# Patient Record
Sex: Male | Born: 1945 | Race: Black or African American | Hispanic: No
Health system: Southern US, Community
[De-identification: ages and names within clinical notes are randomized; demographics above are authoritative.]

## PROBLEM LIST (undated history)

## (undated) DIAGNOSIS — Z992 Dependence on renal dialysis: Secondary | ICD-10-CM

## (undated) DIAGNOSIS — N186 End stage renal disease: Secondary | ICD-10-CM

## (undated) DIAGNOSIS — J9621 Acute and chronic respiratory failure with hypoxia: Secondary | ICD-10-CM

## (undated) DIAGNOSIS — I5022 Chronic systolic (congestive) heart failure: Secondary | ICD-10-CM

## (undated) DIAGNOSIS — G40909 Epilepsy, unspecified, not intractable, without status epilepticus: Secondary | ICD-10-CM

## (undated) DIAGNOSIS — J181 Lobar pneumonia, unspecified organism: Secondary | ICD-10-CM

---

## 2018-05-03 ENCOUNTER — Inpatient Hospital Stay
Admission: AD | Admit: 2018-05-03 | Discharge: 2018-06-21 | Disposition: A | Discharge status: Home Health | Payer: Medicaid Other | Source: Other Acute Inpatient Hospital | Attending: Internal Medicine | Admitting: Internal Medicine

## 2018-05-03 ENCOUNTER — Other Ambulatory Visit (HOSPITAL_COMMUNITY): Payer: Medicaid Other

## 2018-05-03 DIAGNOSIS — G40909 Epilepsy, unspecified, not intractable, without status epilepticus: Secondary | ICD-10-CM

## 2018-05-03 DIAGNOSIS — J181 Lobar pneumonia, unspecified organism: Secondary | ICD-10-CM | POA: Diagnosis present

## 2018-05-03 DIAGNOSIS — Z9911 Dependence on respirator [ventilator] status: Secondary | ICD-10-CM

## 2018-05-03 DIAGNOSIS — R238 Other skin changes: Secondary | ICD-10-CM

## 2018-05-03 DIAGNOSIS — Z9289 Personal history of other medical treatment: Secondary | ICD-10-CM

## 2018-05-03 DIAGNOSIS — R609 Edema, unspecified: Secondary | ICD-10-CM

## 2018-05-03 DIAGNOSIS — J9621 Acute and chronic respiratory failure with hypoxia: Secondary | ICD-10-CM | POA: Diagnosis present

## 2018-05-03 DIAGNOSIS — R509 Fever, unspecified: Secondary | ICD-10-CM

## 2018-05-03 DIAGNOSIS — Z4659 Encounter for fitting and adjustment of other gastrointestinal appliance and device: Secondary | ICD-10-CM

## 2018-05-03 DIAGNOSIS — J9690 Respiratory failure, unspecified, unspecified whether with hypoxia or hypercapnia: Secondary | ICD-10-CM

## 2018-05-03 DIAGNOSIS — Z0189 Encounter for other specified special examinations: Secondary | ICD-10-CM

## 2018-05-03 DIAGNOSIS — Z992 Dependence on renal dialysis: Secondary | ICD-10-CM

## 2018-05-03 DIAGNOSIS — N186 End stage renal disease: Secondary | ICD-10-CM

## 2018-05-03 DIAGNOSIS — I5022 Chronic systolic (congestive) heart failure: Secondary | ICD-10-CM | POA: Diagnosis present

## 2018-05-03 HISTORY — DX: Dependence on renal dialysis: N18.6

## 2018-05-03 HISTORY — DX: Acute and chronic respiratory failure with hypoxia: J96.21

## 2018-05-03 HISTORY — DX: Chronic systolic (congestive) heart failure: I50.22

## 2018-05-03 HISTORY — DX: Lobar pneumonia, unspecified organism: J18.1

## 2018-05-03 HISTORY — DX: Epilepsy, unspecified, not intractable, without status epilepticus: G40.909

## 2018-05-03 HISTORY — DX: Dependence on renal dialysis: Z99.2

## 2018-05-03 LAB — BLOOD GAS, ARTERIAL
Acid-Base Excess: 3.1 mmol/L — ABNORMAL HIGH (ref 0.0–2.0)
Bicarbonate: 27 mmol/L (ref 20.0–28.0)
FIO2: 0.28
LHR: 12 {breaths}/min
O2 Saturation: 96.3 %
PATIENT TEMPERATURE: 98.6
PEEP: 5 cmH2O
PO2 ART: 79.7 mmHg — AB (ref 83.0–108.0)
pCO2 arterial: 40.7 mmHg (ref 32.0–48.0)
pH, Arterial: 7.437 (ref 7.350–7.450)

## 2018-05-03 LAB — CLOSTRIDIUM DIFFICILE BY PCR, REFLEXED: Toxigenic C. Difficile by PCR: POSITIVE — AB

## 2018-05-03 LAB — C DIFFICILE QUICK SCREEN W PCR REFLEX
C DIFFICILE (CDIFF) TOXIN: NEGATIVE
C Diff antigen: POSITIVE — AB

## 2018-05-03 MED ORDER — SODIUM CHLORIDE 0.9 % IV SOLN
150.00 | INTRAVENOUS | Status: DC
Start: ? — End: 2018-05-03

## 2018-05-03 MED ORDER — GENERIC EXTERNAL MEDICATION
5.00 | Status: DC
Start: ? — End: 2018-05-03

## 2018-05-03 MED ORDER — PROMETHAZINE HCL 25 MG/ML IJ SOLN
25.00 | INTRAMUSCULAR | Status: DC
Start: ? — End: 2018-05-03

## 2018-05-03 MED ORDER — DIPHENHYDRAMINE HCL 50 MG/ML IJ SOLN
12.50 | INTRAMUSCULAR | Status: DC
Start: ? — End: 2018-05-03

## 2018-05-03 MED ORDER — GENERIC EXTERNAL MEDICATION
1.00 | Status: DC
Start: ? — End: 2018-05-03

## 2018-05-03 MED ORDER — LIDOCAINE HCL (PF) 1 % IJ SOLN
0.10 | INTRAMUSCULAR | Status: DC
Start: ? — End: 2018-05-03

## 2018-05-03 MED ORDER — EPOETIN ALFA-EPBX 4000 UNIT/ML IJ SOLN
50.00 | INTRAMUSCULAR | Status: DC
Start: 2018-05-04 — End: 2018-05-03

## 2018-05-03 MED ORDER — HYDRALAZINE HCL 50 MG PO TABS
100.00 | ORAL_TABLET | ORAL | Status: DC
Start: 2018-05-03 — End: 2018-05-03

## 2018-05-03 MED ORDER — GENERIC EXTERNAL MEDICATION
1.00 | Status: DC
Start: 2018-05-04 — End: 2018-05-03

## 2018-05-03 MED ORDER — SODIUM CHLORIDE (PF) 0.9 % IJ SOLN
50.00 | INTRAMUSCULAR | Status: DC
Start: ? — End: 2018-05-03

## 2018-05-03 MED ORDER — CHLORHEXIDINE GLUCONATE 0.12 % MT SOLN
15.00 | OROMUCOSAL | Status: DC
Start: 2018-05-03 — End: 2018-05-03

## 2018-05-03 MED ORDER — MANNITOL 25 % IV SOLN
12.50 | INTRAVENOUS | Status: DC
Start: ? — End: 2018-05-03

## 2018-05-03 MED ORDER — ACETAMINOPHEN 160 MG/5ML PO SUSP
650.00 | ORAL | Status: DC
Start: ? — End: 2018-05-03

## 2018-05-03 MED ORDER — ALBUMIN HUMAN 25 % IV SOLN
12.50 | INTRAVENOUS | Status: DC
Start: ? — End: 2018-05-03

## 2018-05-03 MED ORDER — ATENOLOL 50 MG PO TABS
25.00 | ORAL_TABLET | ORAL | Status: DC
Start: 2018-05-03 — End: 2018-05-03

## 2018-05-03 MED ORDER — LORAZEPAM 2 MG/ML IJ SOLN
1.00 | INTRAMUSCULAR | Status: DC
Start: ? — End: 2018-05-03

## 2018-05-03 MED ORDER — HYDRALAZINE HCL 20 MG/ML IJ SOLN
10.00 | INTRAMUSCULAR | Status: DC
Start: ? — End: 2018-05-03

## 2018-05-03 MED ORDER — GENERIC EXTERNAL MEDICATION
15.00 | Status: DC
Start: ? — End: 2018-05-03

## 2018-05-03 MED ORDER — GENERIC EXTERNAL MEDICATION
25.00 | Status: DC
Start: ? — End: 2018-05-03

## 2018-05-03 MED ORDER — FAMOTIDINE 20 MG/2ML IV SOLN
20.00 | INTRAVENOUS | Status: DC
Start: 2018-05-04 — End: 2018-05-03

## 2018-05-03 MED ORDER — AMLODIPINE BESYLATE 10 MG PO TABS
10.00 | ORAL_TABLET | ORAL | Status: DC
Start: 2018-05-03 — End: 2018-05-03

## 2018-05-03 MED ORDER — GENERIC EXTERNAL MEDICATION
750.00 | Status: DC
Start: 2018-05-04 — End: 2018-05-03

## 2018-05-03 MED ORDER — LABETALOL HCL 5 MG/ML IV SOLN
10.00 | INTRAVENOUS | Status: DC
Start: ? — End: 2018-05-03

## 2018-05-03 MED ORDER — HEPARIN SODIUM (PORCINE) 5000 UNIT/ML IJ SOLN
5000.00 | INTRAMUSCULAR | Status: DC
Start: 2018-05-03 — End: 2018-05-03

## 2018-05-03 MED ORDER — NICARDIPINE HCL IN NACL 40-0.83 MG/200ML-% IV SOLN
1.00 | INTRAVENOUS | Status: DC
Start: ? — End: 2018-05-03

## 2018-05-03 MED ORDER — SODIUM CHLORIDE 3 % IN NEBU
4.00 | INHALATION_SOLUTION | RESPIRATORY_TRACT | Status: DC
Start: ? — End: 2018-05-03

## 2018-05-03 MED ORDER — LEVETIRACETAM IN NACL 500 MG/100ML IV SOLN
500.00 | INTRAVENOUS | Status: DC
Start: 2018-05-04 — End: 2018-05-03

## 2018-05-03 MED ORDER — GUAIFENESIN 100 MG/5ML PO LIQD
200.00 | ORAL | Status: DC
Start: ? — End: 2018-05-03

## 2018-05-03 MED ORDER — ONDANSETRON HCL 4 MG/2ML IJ SOLN
4.00 | INTRAMUSCULAR | Status: DC
Start: ? — End: 2018-05-03

## 2018-05-04 ENCOUNTER — Other Ambulatory Visit (HOSPITAL_COMMUNITY): Payer: Medicaid Other

## 2018-05-04 DIAGNOSIS — N186 End stage renal disease: Secondary | ICD-10-CM | POA: Diagnosis not present

## 2018-05-04 DIAGNOSIS — J181 Lobar pneumonia, unspecified organism: Secondary | ICD-10-CM

## 2018-05-04 DIAGNOSIS — J9621 Acute and chronic respiratory failure with hypoxia: Secondary | ICD-10-CM

## 2018-05-04 DIAGNOSIS — I5022 Chronic systolic (congestive) heart failure: Secondary | ICD-10-CM | POA: Diagnosis not present

## 2018-05-04 DIAGNOSIS — Z992 Dependence on renal dialysis: Secondary | ICD-10-CM

## 2018-05-04 DIAGNOSIS — G40909 Epilepsy, unspecified, not intractable, without status epilepticus: Secondary | ICD-10-CM

## 2018-05-04 LAB — CBC WITH DIFFERENTIAL/PLATELET
ABS IMMATURE GRANULOCYTES: 0.11 10*3/uL — AB (ref 0.00–0.07)
Basophils Absolute: 0 10*3/uL (ref 0.0–0.1)
Basophils Relative: 0 %
Eosinophils Absolute: 0.6 10*3/uL — ABNORMAL HIGH (ref 0.0–0.5)
Eosinophils Relative: 5 %
HCT: 25.8 % — ABNORMAL LOW (ref 39.0–52.0)
Hemoglobin: 8.1 g/dL — ABNORMAL LOW (ref 13.0–17.0)
Immature Granulocytes: 1 %
Lymphocytes Relative: 3 %
Lymphs Abs: 0.4 10*3/uL — ABNORMAL LOW (ref 0.7–4.0)
MCH: 33.1 pg (ref 26.0–34.0)
MCHC: 31.4 g/dL (ref 30.0–36.0)
MCV: 105.3 fL — AB (ref 80.0–100.0)
Monocytes Absolute: 1 10*3/uL (ref 0.1–1.0)
Monocytes Relative: 8 %
Neutro Abs: 10.5 10*3/uL — ABNORMAL HIGH (ref 1.7–7.7)
Neutrophils Relative %: 83 %
PLATELETS: 347 10*3/uL (ref 150–400)
RBC: 2.45 MIL/uL — ABNORMAL LOW (ref 4.22–5.81)
RDW: 17.3 % — ABNORMAL HIGH (ref 11.5–15.5)
WBC: 12.5 10*3/uL — ABNORMAL HIGH (ref 4.0–10.5)
nRBC: 0 % (ref 0.0–0.2)

## 2018-05-04 LAB — COMPREHENSIVE METABOLIC PANEL
ALT: 30 U/L (ref 0–44)
AST: 28 U/L (ref 15–41)
Albumin: 2.3 g/dL — ABNORMAL LOW (ref 3.5–5.0)
Alkaline Phosphatase: 89 U/L (ref 38–126)
Anion gap: 16 — ABNORMAL HIGH (ref 5–15)
BUN: 66 mg/dL — ABNORMAL HIGH (ref 8–23)
CO2: 25 mmol/L (ref 22–32)
Calcium: 9.3 mg/dL (ref 8.9–10.3)
Chloride: 95 mmol/L — ABNORMAL LOW (ref 98–111)
Creatinine, Ser: 7.2 mg/dL — ABNORMAL HIGH (ref 0.61–1.24)
GFR calc Af Amer: 8 mL/min — ABNORMAL LOW (ref >60)
GFR, EST NON AFRICAN AMERICAN: 7 mL/min — AB (ref >60)
Glucose, Bld: 114 mg/dL — ABNORMAL HIGH (ref 70–99)
Potassium: 3.4 mmol/L — ABNORMAL LOW (ref 3.5–5.1)
Sodium: 136 mmol/L (ref 135–145)
Total Bilirubin: 0.6 mg/dL (ref 0.3–1.2)
Total Protein: 7.5 g/dL (ref 6.5–8.1)

## 2018-05-04 LAB — EXPECTORATED SPUTUM ASSESSMENT W GRAM STAIN, RFLX TO RESP C

## 2018-05-04 LAB — HEMOGLOBIN A1C
Hgb A1c MFr Bld: 4.7 % — ABNORMAL LOW (ref 4.8–5.6)
Mean Plasma Glucose: 88.19 mg/dL

## 2018-05-04 LAB — EXPECTORATED SPUTUM ASSESSMENT W REFEX TO RESP CULTURE

## 2018-05-04 LAB — MAGNESIUM: MAGNESIUM: 2.5 mg/dL — AB (ref 1.7–2.4)

## 2018-05-04 LAB — PROTIME-INR
INR: 1 (ref 0.8–1.2)
PROTHROMBIN TIME: 12.7 s (ref 11.4–15.2)

## 2018-05-04 LAB — TSH: TSH: 4.028 u[IU]/mL (ref 0.350–4.500)

## 2018-05-04 LAB — PHOSPHORUS: Phosphorus: 4.5 mg/dL (ref 2.5–4.6)

## 2018-05-04 NOTE — Consult Note (Signed)
Pulmonary [place]  Date of Service: 05/04/2018  PULMONARY CRITICAL CARE CONSULT   Jared Hancock  CBU:[id number]  DOB: 12-19-1945   DOA: 05/03/2018  Referring Physician: Merton Border, MD  HPI: Jared Hancock is a 73 y.o. male seen for follow up of Acute on Chronic Respiratory Failure.  Patient has multiple medical problems including COPD end-stage renal disease hypertension cirrhosis hepatitis C chronic systolic heart failure depression presented to the hospital with an acute onset of seizure.  Patient also had a seizure in the emergency department patient was given Versed and Ativan along with Keppra.  He was admitted to the neuro ICU intubated but was unresponsive because of agonal respirations.  Apparently patient developed hypotension and had a cardiac arrest.  Patient received CPR with return of circulation.  As far as renal failure is concerned the patient was seen by nephrology for dialysis.  An echocardiogram was done noted to have an ejection fraction of 55 to 60%.  His remainder of the admission he had a continuous EEG done and his meds were titrated accordingly.  CT scan was done and it was unremarkable.  MRI was not done.  Subsequently patient was transferred to our facility for further management and weaning.  Review of Systems:  ROS performed and is unremarkable other than noted above.  Past Medical History:  Diagnosis Date  ?. Gout  ?Marland Kitchen Hypertension  ?Marland Kitchen Renal disorder  ?. Seizure (*) 04/20/2018   Past Surgical History:  Procedure Laterality Date  ?Marland Kitchen Bowel resection  ?Marland Kitchen Thrombectomy Left 01/27/2018  Thrombectomy w/PTA Left Arm AVF/Dr.Workman   Allergies  Allergen Reactions  ?Marland Kitchen Ampicillin Anaphylaxis  ?Marland Kitchen Shellfish Allergy Anaphylaxis  ?Marland Kitchen Tape [Contact Dermatitis] Rash  Paper tape is ok  ?Marland Kitchen Tetracyclines & Related Swelling   Social history: Patient not able to volunteer Based on the chart patient has a history  of smoking no alcohol or drug abuse  Family history: Positive for hypertension alcohol abuse cancer liver disease  Medications: Reviewed on Rounds  Physical Exam:  Vitals: Temperature 97.2 pulse 66 respiratory rate 19 blood pressure 168/90 saturations 100%  Ventilator Settings patient is pressure assist control FiO2 is 28% tidal volume is 500 PEEP 5  . General: Comfortable at this time . Eyes: Grossly normal lids, irises & conjunctiva . ENT: grossly tongue is normal . Neck: no obvious mass . Cardiovascular: S1-S2 normal no gallop or rub . Respiratory: Scattered distant rhonchi are noted . Abdomen: Soft and nontender at this time . Skin: no rash seen on limited exam . Musculoskeletal: not rigid . Psychiatric:unable to assess . Neurologic: no seizure no involuntary movements         Labs on Admission:  Basic Metabolic Panel: Recent Labs  Lab 05/04/18 0651  NA 136  K 3.4*  CL 95*  CO2 25  GLUCOSE 114*  BUN 66*  CREATININE 7.20*  CALCIUM 9.3  MG 2.5*  PHOS 4.5    Recent Labs  Lab 05/03/18 1740  PHART 7.437  PCO2ART 40.7  PO2ART 79.7*  HCO3 27.0  O2SAT 96.3    Liver Function Tests: Recent Labs  Lab 05/04/18 0651  AST 28  ALT 30  ALKPHOS 89  BILITOT 0.6  PROT 7.5  ALBUMIN 2.3*   No results for input(s): LIPASE, AMYLASE in the last 168 hours. No results for input(s): AMMONIA in the last 168 hours.  CBC: Recent Labs  Lab 05/04/18 0651  WBC 12.5*  NEUTROABS  10.5*  HGB 8.1*  HCT 25.8*  MCV 105.3*  PLT 347    Cardiac Enzymes: No results for input(s): CKTOTAL, CKMB, CKMBINDEX, TROPONINI in the last 168 hours.  BNP (last 3 results) No results for input(s): BNP in the last 8760 hours.  ProBNP (last 3 results) No results for input(s): PROBNP in the last 8760 hours.   Radiological Exams on Admission: Dg Chest Port 1 View  Result Date: 05/03/2018 CLINICAL DATA:  Endotracheal tube placement. EXAM: PORTABLE CHEST 1 VIEW COMPARISON:  None.  FINDINGS: Endotracheal tube tip projects 7 cm above the carina. LEFT internal jugular central venous catheter distal tip projects in proximal superior vena cava. Nasogastric tube past GE junction, distal tip out of field of view. Tunneled dialysis catheter via RIGHT internal jugular venous approach distal tip projects in mid superior vena cava. LEFT axillary vascular stent. Mild cardiomegaly. Mildly calcified aortic arch. Bronchitic changes without pleural effusion or focal consolidation. No pneumothorax. Soft tissue planes included osseous structures are non suspicious. IMPRESSION: 1. Endotracheal tube tip projects 7 cm above the carina. LEFT internal jugular central venous catheter distal tip projects in proximal superior vena cava. Nasogastric tube past GE junction. 2. Mild cardiomegaly. Bronchitic changes without focal consolidation. Electronically Signed   By: Elon Alas M.D.   On: 05/03/2018 19:46   Dg Abd Portable 1v  Result Date: 05/03/2018 CLINICAL DATA:  OG tube EXAM: PORTABLE ABDOMEN - 1 VIEW COMPARISON:  None. FINDINGS: Esophageal tube tip projects over the distal stomach. Probable vascular calcifications within the upper quadrants. Gas pattern is nonobstructed. Metallic opacities within the pelvis. IMPRESSION: 1. Esophageal tube tip overlies the distal stomach. 2. Nonobstructed gas pattern Electronically Signed   By: Donavan Foil M.D.   On: 05/03/2018 19:46    Assessment/Plan Active Problems:   Acute on chronic respiratory failure with hypoxia (HCC)   Seizure disorder (HCC)   End stage renal disease on dialysis ([place])   Lobar pneumonia (HCC)   Chronic systolic heart failure ([place])   1. Acute on chronic respiratory failure with hypoxia at this time patient is on the ventilator and full support.  We are going to try to begin the weaning process.  Patient has not tolerated the RSB I is at the other facility.  We will reassess 2. Seizure disorder currently patient does not have any  active seizures we will continue with supportive care. 3. End-stage renal disease on hemodialysis nephrology will follow and manage the dialysis 4. Lobar pneumonia patient has been treated with antibiotics we will continue with supportive care. 5. Chronic systolic heart failure last ejection fraction was adequate continue with supportive care and monitor.  I have personally seen and evaluated the patient, evaluated laboratory and imaging results, formulated the assessment and plan and placed orders. The Patient requires high complexity decision making for assessment and support.  Case was discussed on Rounds with the Respiratory Therapy Staff Time Spent 29minutes  Allyne Gee, MD [facility] Pulmonary Critical Care Medicine Sleep Medicine

## 2018-05-05 ENCOUNTER — Encounter: Payer: Self-pay | Admitting: Internal Medicine

## 2018-05-05 DIAGNOSIS — J181 Lobar pneumonia, unspecified organism: Secondary | ICD-10-CM | POA: Diagnosis present

## 2018-05-05 DIAGNOSIS — I5022 Chronic systolic (congestive) heart failure: Secondary | ICD-10-CM | POA: Diagnosis not present

## 2018-05-05 DIAGNOSIS — J9621 Acute and chronic respiratory failure with hypoxia: Secondary | ICD-10-CM | POA: Diagnosis not present

## 2018-05-05 DIAGNOSIS — N186 End stage renal disease: Secondary | ICD-10-CM | POA: Diagnosis not present

## 2018-05-05 DIAGNOSIS — G40909 Epilepsy, unspecified, not intractable, without status epilepticus: Secondary | ICD-10-CM

## 2018-05-05 DIAGNOSIS — Z992 Dependence on renal dialysis: Secondary | ICD-10-CM

## 2018-05-05 LAB — RENAL FUNCTION PANEL
Albumin: 2.3 g/dL — ABNORMAL LOW (ref 3.5–5.0)
Anion gap: 16 — ABNORMAL HIGH (ref 5–15)
BUN: 81 mg/dL — ABNORMAL HIGH (ref 8–23)
CO2: 21 mmol/L — ABNORMAL LOW (ref 22–32)
Calcium: 9.2 mg/dL (ref 8.9–10.3)
Chloride: 97 mmol/L — ABNORMAL LOW (ref 98–111)
Creatinine, Ser: 8.73 mg/dL — ABNORMAL HIGH (ref 0.61–1.24)
GFR calc Af Amer: 6 mL/min — ABNORMAL LOW (ref >60)
GFR calc non Af Amer: 6 mL/min — ABNORMAL LOW (ref >60)
Glucose, Bld: 106 mg/dL — ABNORMAL HIGH (ref 70–99)
Phosphorus: 4.6 mg/dL (ref 2.5–4.6)
Potassium: 3.6 mmol/L (ref 3.5–5.1)
Sodium: 134 mmol/L — ABNORMAL LOW (ref 135–145)

## 2018-05-05 LAB — CBC
HCT: 26.3 % — ABNORMAL LOW (ref 39.0–52.0)
Hemoglobin: 8.5 g/dL — ABNORMAL LOW (ref 13.0–17.0)
MCH: 33.9 pg (ref 26.0–34.0)
MCHC: 32.3 g/dL (ref 30.0–36.0)
MCV: 104.8 fL — ABNORMAL HIGH (ref 80.0–100.0)
NRBC: 0 % (ref 0.0–0.2)
Platelets: 390 10*3/uL (ref 150–400)
RBC: 2.51 MIL/uL — ABNORMAL LOW (ref 4.22–5.81)
RDW: 17.2 % — ABNORMAL HIGH (ref 11.5–15.5)
WBC: 10.7 10*3/uL — AB (ref 4.0–10.5)

## 2018-05-05 LAB — MAGNESIUM: Magnesium: 2.7 mg/dL — ABNORMAL HIGH (ref 1.7–2.4)

## 2018-05-05 NOTE — Consult Note (Signed)
[facility] CONSULT NOTE    Date: 05/05/2018                  Patient Name:  Jared Hancock  MRN: [id number]  DOB: [birth year]  Age / Sex: 73 y.o., male         PCP: Rosaria Ferries, MD                 Service Requesting Consult: Hospitalist                 Reason for Consult: Evaluation and management of ESRD            History of Present Illness: Patient is a 73 y.o. male with a PMHx of COPD, end-stage renal disease, hypertension, hepatitis C, cirrhosis, chronic systolic heart failure, depression, anemia of chronic kidney disease, secondary hyperparathyroidism, seizure disorder, who was admitted to [facility] on 05/03/2018 for ongoing treatment of respiratory failure and end-stage renal disease.  He presented to outside hospital with new onset weakness and seizures at home.  He was admitted to the neuro ICU and emergently intubated.  He had a brief cardiac arrest with PEA with return to spontaneous circulation achieved after 1 round of CPR.  Patient remains intubated at this time.  We are asked to see him for evaluation management of end-stage renal disease.  He was successfully dialyzed during his hospitalization previously.  He received Epogen for his anemia of chronic kidney disease.   He is not on any binder therapy at the moment.   Medications:  Current medications: Cefepime 1 g daily, fluconazole 200 mg daily, Keppra 750 mg daily, Humalog sliding scale, amlodipine 10 mg daily, atenolol 25 mg twice daily, Pepcid 20 mg twice daily, fluconazole 200 mg daily, heparin 5000 units every 8 hours, hydralazine 1 mg 3 times daily, scopolamine patch every 72 hours   Allergies: Ampicillin, tetracycline, shellfish derived products   Past Medical History: Past Medical History:  Diagnosis Date  . Acute on chronic respiratory failure with hypoxia ([place])   . Chronic systolic heart failure ([place])   . End stage renal disease on dialysis ([place])   . Lobar pneumonia ([place])    . Seizure disorder [facility])      Past Surgical History: Dialysis catheter placement. Bowel resection Thrombectomy of left arm AV fistula  Family History: Significant for hypertension and liver disease  Social History: Unable to obtain directly from the patient however per prior records it appears that he does have history of tobacco use.  Review of Systems: Unable to obtain as he is maintained on the ventilator.  Vital Signs: Temperature 97.8 pulse 57 respirations 20 blood pressure 136/64 Weight trends: There were no vitals filed for this visit.  Physical Exam: General: Critically ill appearing  Head: Normocephalic, atraumatic.  Eyes: Anicteric  Nose: Mucous membranes moist, not inflammed, nonerythematous.  Throat: ETT in place  Neck: Supple, trachea midline.  Lungs:  Scattered rhonchi, vent assisted  Heart: S1S2 no rubs  Abdomen:  BS normoactive. Soft, Nondistended, non-tender.  No masses or organomegaly.  Extremities: Trace LE edema  Neurologic: Intubated, not following commands  Skin: No visible rashes, scars.    Lab results: Basic Metabolic Panel: Recent Labs  Lab 05/04/18 0651 05/05/18 0549  NA 136 134*  K 3.4* 3.6  CL 95* 97*  CO2 25 21*  GLUCOSE 114* 106*  BUN 66* 81*  CREATININE 7.20* 8.73*  CALCIUM 9.3 9.2  MG 2.5* 2.7*  PHOS 4.5 4.6  Liver Function Tests: Recent Labs  Lab 05/04/18 0651 05/05/18 0549  AST 28  --   ALT 30  --   ALKPHOS 89  --   BILITOT 0.6  --   PROT 7.5  --   ALBUMIN 2.3* 2.3*   No results for input(s): LIPASE, AMYLASE in the last 168 hours. No results for input(s): AMMONIA in the last 168 hours.  CBC: Recent Labs  Lab 05/04/18 0651 05/05/18 0549  WBC 12.5* 10.7*  NEUTROABS 10.5*  --   HGB 8.1* 8.5*  HCT 25.8* 26.3*  MCV 105.3* 104.8*  PLT 347 390    Cardiac Enzymes: No results for input(s): CKTOTAL, CKMB, CKMBINDEX, TROPONINI in the last 168 hours.  BNP: Invalid input(s): POCBNP  CBG: No results for  input(s): GLUCAP in the last 168 hours.  Microbiology: Results for orders placed or performed during the hospital encounter of 05/03/18  C difficile quick scan w PCR reflex     Status: Abnormal   Collection Time: 05/03/18  7:40 PM  Result Value Ref Range Status   C Diff antigen POSITIVE (A) NEGATIVE Final   C Diff toxin NEGATIVE NEGATIVE Final   C Diff interpretation Results are indeterminate. See PCR results.  Final    Comment: Performed at [facility], [place] [address], [place], [place] [address]  C. Diff by PCR, Reflexed     Status: Abnormal   Collection Time: 05/03/18  7:40 PM  Result Value Ref Range Status   Toxigenic C. Difficile by PCR POSITIVE (A) NEGATIVE Final    Comment: Positive for toxigenic C. difficile with little to no toxin production. Only treat if clinical presentation suggests symptomatic illness. Performed at [facility], [place] [address], [place], [place] [address]   Expectorated sputum assessment w rflx to resp cult     Status: None   Collection Time: 05/04/18  9:21 AM  Result Value Ref Range Status   Specimen Description EXPECTORATED SPUTUM  Final   Special Requests NONE  Final   Sputum evaluation   Final    THIS SPECIMEN IS ACCEPTABLE FOR SPUTUM CULTURE Performed at [facility], [place] [address] [place], [place] [address]    Report Status 05/04/2018 FINAL  Final  Culture, respiratory     Status: None (Preliminary result)   Collection Time: 05/04/18  9:21 AM  Result Value Ref Range Status   Specimen Description EXPECTORATED SPUTUM  Final   Special Requests NONE Reflexed from [id number]  Final   Gram Stain   Final    ABUNDANT WBC PRESENT, PREDOMINANTLY PMN RARE SQUAMOUS EPITHELIAL CELLS PRESENT NO ORGANISMS SEEN    Culture   Final    CULTURE REINCUBATED FOR BETTER GROWTH Performed at [facility], [place] [address], [place], [place] [address]    Report Status PENDING  Incomplete    Coagulation Studies: Recent Labs    05/04/18 0651   LABPROT 12.7  INR 1.0    Urinalysis: No results for input(s): COLORURINE, LABSPEC, PHURINE, GLUCOSEU, HGBUR, BILIRUBINUR, KETONESUR, PROTEINUR, UROBILINOGEN, NITRITE, LEUKOCYTESUR in the last 72 hours.  Invalid input(s): APPERANCEUR    Imaging: Dg Chest Port 1 View  Result Date: 05/04/2018 CLINICAL DATA:  New ETT cuff, and new NG tube EXAM: PORTABLE CHEST 1 VIEW COMPARISON:  05/03/2018 FINDINGS: Endotracheal tube tip projects 2.4 cm above the carina. Nasal/orogastric tube passes below the diaphragm into the mid stomach. Tunneled dual lumen central venous right internal jugular catheter and left central venous catheter  are stable. Cardiac silhouette is normal in size. No mediastinal or hilar masses. Mild atelectasis at the left lung base.  Lungs otherwise clear. No pneumothorax. IMPRESSION: 1. New endotracheal tube tip projects 2.4 cm above the carina. 2. Nasal/orogastric tube passes below the diaphragm into the mid stomach. 3. Mild left lung base atelectasis. Lungs otherwise clear. No other change from the prior exam. Electronically Signed   By: Lajean Manes M.D.   On: 05/04/2018 18:01   Dg Chest Port 1 View  Result Date: 05/03/2018 CLINICAL DATA:  Endotracheal tube placement. EXAM: PORTABLE CHEST 1 VIEW COMPARISON:  None. FINDINGS: Endotracheal tube tip projects 7 cm above the carina. LEFT internal jugular central venous catheter distal tip projects in proximal superior vena cava. Nasogastric tube past GE junction, distal tip out of field of view. Tunneled dialysis catheter via RIGHT internal jugular venous approach distal tip projects in mid superior vena cava. LEFT axillary vascular stent. Mild cardiomegaly. Mildly calcified aortic arch. Bronchitic changes without pleural effusion or focal consolidation. No pneumothorax. Soft tissue planes included osseous structures are non suspicious. IMPRESSION: 1. Endotracheal tube tip projects 7 cm above the carina. LEFT internal jugular central venous  catheter distal tip projects in proximal superior vena cava. Nasogastric tube past GE junction. 2. Mild cardiomegaly. Bronchitic changes without focal consolidation. Electronically Signed   By: Elon Alas M.D.   On: 05/03/2018 19:46   Dg Abd Portable 1v  Result Date: 05/03/2018 CLINICAL DATA:  OG tube EXAM: PORTABLE ABDOMEN - 1 VIEW COMPARISON:  None. FINDINGS: Esophageal tube tip projects over the distal stomach. Probable vascular calcifications within the upper quadrants. Gas pattern is nonobstructed. Metallic opacities within the pelvis. IMPRESSION: 1. Esophageal tube tip overlies the distal stomach. 2. Nonobstructed gas pattern Electronically Signed   By: Donavan Foil M.D.   On: 05/03/2018 19:46      Assessment & Plan: Pt is a 73 y.o. male  with a PMHx of COPD, end-stage renal disease, hypertension, hepatitis C, cirrhosis, chronic systolic heart failure, depression, anemia of chronic kidney disease, secondary hyperparathyroidism, seizure disorder, who was admitted to [facility] on 05/03/2018 for ongoing treatment of respiratory failure and end-stage renal disease.   1.  ESRD on HD.  We have seen and evaluated the patient on hemodialysis today.  Tolerating well.  Ultrafiltration target 1.5 kg.  We plan to complete dialysis treatment today and next dialysis treatment will be on Monday.  2.  Acute respiratory failure.  Continue current ventilatory support.  Weaning as per pulmonary/critical care.  3.  Anemia of chronic kidney disease.  Hemoglobin currently 8.5.  Start the patient on Retacrit 10,000 units IV with dialysis.  4.  Secondary hyperparathyroidism.  Check intact PTH and phosphorus during this admission.  Patient off of Renvela at the moment.  5.  Thanks for consultation.

## 2018-05-05 NOTE — Progress Notes (Signed)
Pulmonary Critical Care Medicine [place]   PULMONARY CRITICAL CARE SERVICE  PROGRESS NOTE  Date of Service: 05/05/2018  TRAETON BORDAS  IOE:[id number]  DOB: [birth year]   DOA: 05/03/2018  Referring Physician: Merton Border, MD  HPI: Jared Hancock is a 73 y.o. male seen for follow up of Acute on Chronic Respiratory Failure.  This morning patient remains on the ventilator.  Has not yet been attempted on the wean.  Right now is on full support and pressure control mode  Medications: Reviewed on Rounds  Physical Exam:  Vitals: Temperature 97.8 pulse 57 respiratory rate 20 blood pressure 130/64 saturations 98%  Ventilator Settings mode ventilation pressure assist control FiO2 28% tidal volume 521 PEEP 5  . General: Comfortable at this time . Eyes: Grossly normal lids, irises & conjunctiva . ENT: grossly tongue is normal . Neck: no obvious mass . Cardiovascular: S1 S2 normal no gallop . Respiratory: No rhonchi or rales are noted at this time . Abdomen: soft . Skin: no rash seen on limited exam . Musculoskeletal: not rigid . Psychiatric:unable to assess . Neurologic: no seizure no involuntary movements         Lab Data:   Basic Metabolic Panel: Recent Labs  Lab 05/04/18 0651 05/05/18 0549  NA 136 134*  K 3.4* 3.6  CL 95* 97*  CO2 25 21*  GLUCOSE 114* 106*  BUN 66* 81*  CREATININE 7.20* 8.73*  CALCIUM 9.3 9.2  MG 2.5* 2.7*  PHOS 4.5 4.6    ABG: Recent Labs  Lab 05/03/18 1740  PHART 7.437  PCO2ART 40.7  PO2ART 79.7*  HCO3 27.0  O2SAT 96.3    Liver Function Tests: Recent Labs  Lab 05/04/18 0651 05/05/18 0549  AST 28  --   ALT 30  --   ALKPHOS 89  --   BILITOT 0.6  --   PROT 7.5  --   ALBUMIN 2.3* 2.3*   No results for input(s): LIPASE, AMYLASE in the last 168 hours. No results for input(s): AMMONIA in the last 168 hours.  CBC: Recent Labs  Lab 05/04/18 0651 05/05/18 0549  WBC 12.5* 10.7*  NEUTROABS 10.5*  --   HGB 8.1* 8.5*   HCT 25.8* 26.3*  MCV 105.3* 104.8*  PLT 347 390    Cardiac Enzymes: No results for input(s): CKTOTAL, CKMB, CKMBINDEX, TROPONINI in the last 168 hours.  BNP (last 3 results) No results for input(s): BNP in the last 8760 hours.  ProBNP (last 3 results) No results for input(s): PROBNP in the last 8760 hours.  Radiological Exams: Dg Chest Port 1 View  Result Date: 05/04/2018 CLINICAL DATA:  New ETT cuff, and new NG tube EXAM: PORTABLE CHEST 1 VIEW COMPARISON:  05/03/2018 FINDINGS: Endotracheal tube tip projects 2.4 cm above the carina. Nasal/orogastric tube passes below the diaphragm into the mid stomach. Tunneled dual lumen central venous right internal jugular catheter and left central venous catheter are stable. Cardiac silhouette is normal in size. No mediastinal or hilar masses. Mild atelectasis at the left lung base.  Lungs otherwise clear. No pneumothorax. IMPRESSION: 1. New endotracheal tube tip projects 2.4 cm above the carina. 2. Nasal/orogastric tube passes below the diaphragm into the mid stomach. 3. Mild left lung base atelectasis. Lungs otherwise clear. No other change from the prior exam. Electronically Signed   By: Lajean Manes M.D.   On: 05/04/2018 18:01   Dg Chest Port 1 View  Result Date: 05/03/2018 CLINICAL DATA:  Endotracheal tube placement.  EXAM: PORTABLE CHEST 1 VIEW COMPARISON:  None. FINDINGS: Endotracheal tube tip projects 7 cm above the carina. LEFT internal jugular central venous catheter distal tip projects in proximal superior vena cava. Nasogastric tube past GE junction, distal tip out of field of view. Tunneled dialysis catheter via RIGHT internal jugular venous approach distal tip projects in mid superior vena cava. LEFT axillary vascular stent. Mild cardiomegaly. Mildly calcified aortic arch. Bronchitic changes without pleural effusion or focal consolidation. No pneumothorax. Soft tissue planes included osseous structures are non suspicious. IMPRESSION: 1.  Endotracheal tube tip projects 7 cm above the carina. LEFT internal jugular central venous catheter distal tip projects in proximal superior vena cava. Nasogastric tube past GE junction. 2. Mild cardiomegaly. Bronchitic changes without focal consolidation. Electronically Signed   By: Elon Alas M.D.   On: 05/03/2018 19:46   Dg Abd Portable 1v  Result Date: 05/03/2018 CLINICAL DATA:  OG tube EXAM: PORTABLE ABDOMEN - 1 VIEW COMPARISON:  None. FINDINGS: Esophageal tube tip projects over the distal stomach. Probable vascular calcifications within the upper quadrants. Gas pattern is nonobstructed. Metallic opacities within the pelvis. IMPRESSION: 1. Esophageal tube tip overlies the distal stomach. 2. Nonobstructed gas pattern Electronically Signed   By: Donavan Foil M.D.   On: 05/03/2018 19:46    Assessment/Plan Active Problems:   Acute on chronic respiratory failure with hypoxia (HCC)   Seizure disorder (HCC)   End stage renal disease on dialysis ([place])   Lobar pneumonia (HCC)   Chronic systolic heart failure ([place])   1. Acute on chronic respiratory failure with hypoxia we will continue with full support on pressure control titrate oxygen continue pulmonary toilet. 2. Seizure disorder no active seizures are noted. 3. End-stage renal disease on dialysis followed by nephrology 4. Lobar pneumonia treated clinically improving 5. Chronic systolic heart failure compensated at baseline continue with dialysis for fluid management   I have personally seen and evaluated the patient, evaluated laboratory and imaging results, formulated the assessment and plan and placed orders. The Patient requires high complexity decision making for assessment and support.  Case was discussed on Rounds with the Respiratory Therapy Staff  Allyne Gee, MD [facility] Pulmonary Critical Care Medicine Sleep Medicine

## 2018-05-06 DIAGNOSIS — N186 End stage renal disease: Secondary | ICD-10-CM | POA: Diagnosis not present

## 2018-05-06 DIAGNOSIS — I5022 Chronic systolic (congestive) heart failure: Secondary | ICD-10-CM | POA: Diagnosis not present

## 2018-05-06 DIAGNOSIS — J9621 Acute and chronic respiratory failure with hypoxia: Secondary | ICD-10-CM | POA: Diagnosis not present

## 2018-05-06 DIAGNOSIS — J181 Lobar pneumonia, unspecified organism: Secondary | ICD-10-CM | POA: Diagnosis not present

## 2018-05-06 LAB — CULTURE, RESPIRATORY W GRAM STAIN: Culture: NORMAL

## 2018-05-06 LAB — PTH, INTACT AND CALCIUM
Calcium, Total (PTH): 8.9 mg/dL (ref 8.6–10.2)
PTH: 52 pg/mL (ref 15–65)

## 2018-05-06 LAB — HEPATITIS B SURFACE ANTIGEN: Hepatitis B Surface Ag: NEGATIVE

## 2018-05-06 LAB — HEPATITIS B CORE ANTIBODY, IGM: Hep B C IgM: NEGATIVE

## 2018-05-06 LAB — HEPATITIS B SURFACE ANTIBODY, QUANTITATIVE: Hep B S AB Quant (Post): 3.5 m[IU]/mL — ABNORMAL LOW (ref >9.9)

## 2018-05-06 NOTE — Progress Notes (Addendum)
Pulmonary Critical Care Medicine [place]   PULMONARY CRITICAL CARE SERVICE  PROGRESS NOTE  Date of Service: 05/06/2018  Jared Hancock  TIR:[id number]  DOB: 1945-07-23   DOA: 05/03/2018  Referring Physician: Merton Border, MD  HPI: Jared Hancock is a 73 y.o. male seen for follow up of Acute on Chronic Respiratory Failure.  Patient has a goal of 8 hours on pressure support today 12/5 with an FiO2 28%.  He did well with this and was placed back on the ventilator to rest.  Medications: Reviewed on Rounds  Physical Exam:  Vitals: Pulse 70 respirations 29 BP 150/76 O2 sat 97% temp 99.1  Ventilator Settings ventilator mode AC PC rate of 12 IP 14 PEEP of 5 FiO2 20%  . General: Comfortable at this time . Eyes: Grossly normal lids, irises & conjunctiva . ENT: grossly tongue is normal . Neck: no obvious mass . Cardiovascular: S1 S2 normal no gallop . Respiratory: No rales or rhonchi noted . Abdomen: soft . Skin: no rash seen on limited exam . Musculoskeletal: not rigid . Psychiatric:unable to assess . Neurologic: no seizure no involuntary movements         Lab Data:   Basic Metabolic Panel: Recent Labs  Lab 05/04/18 0651 05/05/18 0549 05/05/18 0738  NA 136 134*  --   K 3.4* 3.6  --   CL 95* 97*  --   CO2 25 21*  --   GLUCOSE 114* 106*  --   BUN 66* 81*  --   CREATININE 7.20* 8.73*  --   CALCIUM 9.3 9.2 8.9  MG 2.5* 2.7*  --   PHOS 4.5 4.6  --     ABG: Recent Labs  Lab 05/03/18 1740  PHART 7.437  PCO2ART 40.7  PO2ART 79.7*  HCO3 27.0  O2SAT 96.3    Liver Function Tests: Recent Labs  Lab 05/04/18 0651 05/05/18 0549  AST 28  --   ALT 30  --   ALKPHOS 89  --   BILITOT 0.6  --   PROT 7.5  --   ALBUMIN 2.3* 2.3*   No results for input(s): LIPASE, AMYLASE in the last 168 hours. No results for input(s): AMMONIA in the last 168 hours.  CBC: Recent Labs  Lab 05/04/18 0651 05/05/18 0549  WBC 12.5* 10.7*  NEUTROABS 10.5*  --   HGB 8.1*  8.5*  HCT 25.8* 26.3*  MCV 105.3* 104.8*  PLT 347 390    Cardiac Enzymes: No results for input(s): CKTOTAL, CKMB, CKMBINDEX, TROPONINI in the last 168 hours.  BNP (last 3 results) No results for input(s): BNP in the last 8760 hours.  ProBNP (last 3 results) No results for input(s): PROBNP in the last 8760 hours.  Radiological Exams: No results found.  Assessment/Plan Active Problems:   Acute on chronic respiratory failure with hypoxia (HCC)   Seizure disorder (HCC)   End stage renal disease on dialysis ([place])   Lobar pneumonia (HCC)   Chronic systolic heart failure ([place])   1. Acute on chronic respiratory failure with hypoxia continue with full support on pressure control and titrate oxygen as tolerated.  Continue aggressive pulmonary toilet and supportive measures 2. Seizure disorder no active seizures noted 3. NSTEMI disease on dialysis followed by nephrology 4. Lobar ammonia treated clinically improving 5. Chronic systolic failure compensated at baseline continue dialysis for fluid management   I have personally seen and evaluated the patient, evaluated laboratory and imaging results, formulated the assessment and plan  and placed orders. The Patient requires high complexity decision making for assessment and support.  Case was discussed on Rounds with the Respiratory Therapy Staff  Allyne Gee, MD [facility] Pulmonary Critical Care Medicine Sleep Medicine

## 2018-05-07 DIAGNOSIS — J181 Lobar pneumonia, unspecified organism: Secondary | ICD-10-CM | POA: Diagnosis not present

## 2018-05-07 DIAGNOSIS — I5022 Chronic systolic (congestive) heart failure: Secondary | ICD-10-CM | POA: Diagnosis not present

## 2018-05-07 DIAGNOSIS — J9621 Acute and chronic respiratory failure with hypoxia: Secondary | ICD-10-CM | POA: Diagnosis not present

## 2018-05-07 DIAGNOSIS — N186 End stage renal disease: Secondary | ICD-10-CM | POA: Diagnosis not present

## 2018-05-07 LAB — RENAL FUNCTION PANEL
ALBUMIN: 2.2 g/dL — AB (ref 3.5–5.0)
Anion gap: 17 — ABNORMAL HIGH (ref 5–15)
BUN: 60 mg/dL — ABNORMAL HIGH (ref 8–23)
CO2: 20 mmol/L — ABNORMAL LOW (ref 22–32)
CREATININE: 7.6 mg/dL — AB (ref 0.61–1.24)
Calcium: 9 mg/dL (ref 8.9–10.3)
Chloride: 95 mmol/L — ABNORMAL LOW (ref 98–111)
GFR calc Af Amer: 8 mL/min — ABNORMAL LOW (ref >60)
GFR calc non Af Amer: 7 mL/min — ABNORMAL LOW (ref >60)
Glucose, Bld: 104 mg/dL — ABNORMAL HIGH (ref 70–99)
Phosphorus: 4.5 mg/dL (ref 2.5–4.6)
Potassium: 3.7 mmol/L (ref 3.5–5.1)
Sodium: 132 mmol/L — ABNORMAL LOW (ref 135–145)

## 2018-05-07 LAB — CBC
HCT: 25.5 % — ABNORMAL LOW (ref 39.0–52.0)
Hemoglobin: 8.2 g/dL — ABNORMAL LOW (ref 13.0–17.0)
MCH: 33.7 pg (ref 26.0–34.0)
MCHC: 32.2 g/dL (ref 30.0–36.0)
MCV: 104.9 fL — ABNORMAL HIGH (ref 80.0–100.0)
Platelets: 320 10*3/uL (ref 150–400)
RBC: 2.43 MIL/uL — ABNORMAL LOW (ref 4.22–5.81)
RDW: 17.2 % — ABNORMAL HIGH (ref 11.5–15.5)
WBC: 8.2 10*3/uL (ref 4.0–10.5)
nRBC: 0 % (ref 0.0–0.2)

## 2018-05-07 LAB — MAGNESIUM: Magnesium: 2.5 mg/dL — ABNORMAL HIGH (ref 1.7–2.4)

## 2018-05-07 NOTE — Progress Notes (Addendum)
Pulmonary Critical Care Medicine [place]   PULMONARY CRITICAL CARE SERVICE  PROGRESS NOTE  Date of Service: 05/07/2018  KANIEL KIANG  WPY:[id number]  DOB: November 09, 1945   DOA: 05/03/2018  Referring Physician: Merton Border, MD  HPI: XSAVIER Hancock is a 73 y.o. male seen for follow up of Acute on Chronic Respiratory Failure.  Patient was able to tolerate 8 hours on pressure support yesterday and has a goal of 12 hours today.  Continues to have a copious amount of secretions.  Medications: Reviewed on Rounds  Physical Exam:  Vitals: Pulse 83 respirations 20 BP 148/66 O2 sat 99% temp 97.8  Ventilator Settings patient is currently on pressure support 12/5 with an FiO2 of 28%.  When resting on the ventilator is mode of AC PC rate of 12 IP 14 PEEP of 5 FiO2 20%.  . General: Comfortable at this time . Eyes: Grossly normal lids, irises & conjunctiva . ENT: grossly tongue is normal . Neck: no obvious mass . Cardiovascular: S1 S2 normal no gallop . Respiratory: No rales or rhonchi noted . Abdomen: soft . Skin: no rash seen on limited exam . Musculoskeletal: not rigid . Psychiatric:unable to assess . Neurologic: no seizure no involuntary movements         Lab Data:   Basic Metabolic Panel: Recent Labs  Lab 05/04/18 0651 05/05/18 0549 05/05/18 0738 05/07/18 0845  NA 136 134*  --  132*  K 3.4* 3.6  --  3.7  CL 95* 97*  --  95*  CO2 25 21*  --  20*  GLUCOSE 114* 106*  --  104*  BUN 66* 81*  --  60*  CREATININE 7.20* 8.73*  --  7.60*  CALCIUM 9.3 9.2 8.9 9.0  MG 2.5* 2.7*  --  2.5*  PHOS 4.5 4.6  --  4.5    ABG: Recent Labs  Lab 05/03/18 1740  PHART 7.437  PCO2ART 40.7  PO2ART 79.7*  HCO3 27.0  O2SAT 96.3    Liver Function Tests: Recent Labs  Lab 05/04/18 0651 05/05/18 0549 05/07/18 0845  AST 28  --   --   ALT 30  --   --   ALKPHOS 89  --   --   BILITOT 0.6  --   --   PROT 7.5  --   --   ALBUMIN 2.3* 2.3* 2.2*   No results for input(s):  LIPASE, AMYLASE in the last 168 hours. No results for input(s): AMMONIA in the last 168 hours.  CBC: Recent Labs  Lab 05/04/18 0651 05/05/18 0549 05/07/18 0845  WBC 12.5* 10.7* 8.2  NEUTROABS 10.5*  --   --   HGB 8.1* 8.5* 8.2*  HCT 25.8* 26.3* 25.5*  MCV 105.3* 104.8* 104.9*  PLT 347 390 320    Cardiac Enzymes: No results for input(s): CKTOTAL, CKMB, CKMBINDEX, TROPONINI in the last 168 hours.  BNP (last 3 results) No results for input(s): BNP in the last 8760 hours.  ProBNP (last 3 results) No results for input(s): PROBNP in the last 8760 hours.  Radiological Exams: No results found.  Assessment/Plan Active Problems:   Acute on chronic respiratory failure with hypoxia (HCC)   Seizure disorder (HCC)   End stage renal disease on dialysis ([place])   Lobar pneumonia (HCC)   Chronic systolic heart failure ([place])   1. Acute on chronic respiratory failure with hypoxia continue with full support on pressure control and titrate oxygen as tolerated.  Continue supportive measures 2.  Seizure disorder no active seizures noted 3. End-stage renal disease on dialysis followed by nephrology 4. Lobar pneumonia treated clinically improving 5. Chronic systolic heart failure compensated at baseline   I have personally seen and evaluated the patient, evaluated laboratory and imaging results, formulated the assessment and plan and placed orders. The Patient requires high complexity decision making for assessment and support.  Case was discussed on Rounds with the Respiratory Therapy Staff  Allyne Gee, MD [facility] Pulmonary Critical Care Medicine Sleep Medicine

## 2018-05-08 DIAGNOSIS — J181 Lobar pneumonia, unspecified organism: Secondary | ICD-10-CM | POA: Diagnosis not present

## 2018-05-08 DIAGNOSIS — J9621 Acute and chronic respiratory failure with hypoxia: Secondary | ICD-10-CM | POA: Diagnosis not present

## 2018-05-08 DIAGNOSIS — N186 End stage renal disease: Secondary | ICD-10-CM | POA: Diagnosis not present

## 2018-05-08 DIAGNOSIS — I5022 Chronic systolic (congestive) heart failure: Secondary | ICD-10-CM | POA: Diagnosis not present

## 2018-05-08 LAB — CBC
HCT: 24 % — ABNORMAL LOW (ref 39.0–52.0)
Hemoglobin: 7.6 g/dL — ABNORMAL LOW (ref 13.0–17.0)
MCH: 33.2 pg (ref 26.0–34.0)
MCHC: 31.7 g/dL (ref 30.0–36.0)
MCV: 104.8 fL — ABNORMAL HIGH (ref 80.0–100.0)
NRBC: 0 % (ref 0.0–0.2)
Platelets: 362 10*3/uL (ref 150–400)
RBC: 2.29 MIL/uL — ABNORMAL LOW (ref 4.22–5.81)
RDW: 17.4 % — ABNORMAL HIGH (ref 11.5–15.5)
WBC: 8.7 10*3/uL (ref 4.0–10.5)

## 2018-05-08 LAB — RENAL FUNCTION PANEL
Albumin: 2.3 g/dL — ABNORMAL LOW (ref 3.5–5.0)
Anion gap: 16 — ABNORMAL HIGH (ref 5–15)
BUN: 73 mg/dL — ABNORMAL HIGH (ref 8–23)
CO2: 20 mmol/L — AB (ref 22–32)
Calcium: 8.8 mg/dL — ABNORMAL LOW (ref 8.9–10.3)
Chloride: 95 mmol/L — ABNORMAL LOW (ref 98–111)
Creatinine, Ser: 9.12 mg/dL — ABNORMAL HIGH (ref 0.61–1.24)
GFR calc Af Amer: 6 mL/min — ABNORMAL LOW (ref >60)
GFR calc non Af Amer: 5 mL/min — ABNORMAL LOW (ref >60)
GLUCOSE: 98 mg/dL (ref 70–99)
Phosphorus: 5.2 mg/dL — ABNORMAL HIGH (ref 2.5–4.6)
Potassium: 3.7 mmol/L (ref 3.5–5.1)
Sodium: 131 mmol/L — ABNORMAL LOW (ref 135–145)

## 2018-05-08 NOTE — Progress Notes (Signed)
Pulmonary Critical Care Medicine [place]   PULMONARY CRITICAL CARE SERVICE  PROGRESS NOTE  Date of Service: 05/08/2018  Jared Hancock  KKX:[id number]  DOB: [birth year]   DOA: 05/03/2018  Referring Physician: Merton Border, MD  HPI: Jared Hancock is a 73 y.o. male seen for follow up of Acute on Chronic Respiratory Failure.  Patient remains on full support on the ventilator with pressure control.  Has not been tolerating weaning attempts  Medications: Reviewed on Rounds  Physical Exam:  Vitals: Temperature 98.4 pulse 58 respiratory rate 26 blood pressure 167/89 saturations 99%  Ventilator Settings mode ventilation pressure assist control FiO2 28% tidal volume is 595  . General: Comfortable at this time . Eyes: Grossly normal lids, irises & conjunctiva . ENT: grossly tongue is normal . Neck: no obvious mass . Cardiovascular: S1 S2 normal no gallop . Respiratory: Coarse rhonchi expansion is equal . Abdomen: soft . Skin: no rash seen on limited exam . Musculoskeletal: not rigid . Psychiatric:unable to assess . Neurologic: no seizure no involuntary movements         Lab Data:   Basic Metabolic Panel: Recent Labs  Lab 05/04/18 0651 05/05/18 0549 05/05/18 0738 05/07/18 0845 05/08/18 0521  NA 136 134*  --  132* 131*  K 3.4* 3.6  --  3.7 3.7  CL 95* 97*  --  95* 95*  CO2 25 21*  --  20* 20*  GLUCOSE 114* 106*  --  104* 98  BUN 66* 81*  --  60* 73*  CREATININE 7.20* 8.73*  --  7.60* 9.12*  CALCIUM 9.3 9.2 8.9 9.0 8.8*  MG 2.5* 2.7*  --  2.5*  --   PHOS 4.5 4.6  --  4.5 5.2*    ABG: Recent Labs  Lab 05/03/18 1740  PHART 7.437  PCO2ART 40.7  PO2ART 79.7*  HCO3 27.0  O2SAT 96.3    Liver Function Tests: Recent Labs  Lab 05/04/18 0651 05/05/18 0549 05/07/18 0845 05/08/18 0521  AST 28  --   --   --   ALT 30  --   --   --   ALKPHOS 89  --   --   --   BILITOT 0.6  --   --   --   PROT 7.5  --   --   --   ALBUMIN 2.3* 2.3* 2.2* 2.3*   No  results for input(s): LIPASE, AMYLASE in the last 168 hours. No results for input(s): AMMONIA in the last 168 hours.  CBC: Recent Labs  Lab 05/04/18 0651 05/05/18 0549 05/07/18 0845 05/08/18 0521  WBC 12.5* 10.7* 8.2 8.7  NEUTROABS 10.5*  --   --   --   HGB 8.1* 8.5* 8.2* 7.6*  HCT 25.8* 26.3* 25.5* 24.0*  MCV 105.3* 104.8* 104.9* 104.8*  PLT 347 390 320 362    Cardiac Enzymes: No results for input(s): CKTOTAL, CKMB, CKMBINDEX, TROPONINI in the last 168 hours.  BNP (last 3 results) No results for input(s): BNP in the last 8760 hours.  ProBNP (last 3 results) No results for input(s): PROBNP in the last 8760 hours.  Radiological Exams: No results found.  Assessment/Plan Active Problems:   Acute on chronic respiratory failure with hypoxia (HCC)   Seizure disorder (HCC)   End stage renal disease on dialysis ([place])   Lobar pneumonia (HCC)   Chronic systolic heart failure ([place])   1. Acute on chronic respiratory failure with hypoxia patient remains on the ventilator full  support respiratory therapy will assess the mechanics and try to wean once again. 2. Seizure disorder no active seizures are noted 3. End-stage renal disease on dialysis continue with supportive care 4. Lobar pneumonia treated we will continue present management 5. Chronic systolic heart failure at baseline   I have personally seen and evaluated the patient, evaluated laboratory and imaging results, formulated the assessment and plan and placed orders. The Patient requires high complexity decision making for assessment and support.  Case was discussed on Rounds with the Respiratory Therapy Staff  Allyne Gee, MD [facility] Pulmonary Critical Care Medicine Sleep Medicine

## 2018-05-08 NOTE — Progress Notes (Signed)
[facility]  ROUNDING NOTE   Subjective:  Patient seen and evaluated during hemodialysis. Tolerating well. Remains critically ill and still on the ventilator.    Objective:  Vital signs in last 24 hours:  Temperature 98.4 pulse 58 respirations 26 blood pressure 167/89  Physical Exam: General: Critically ill-appearing  Head: ETT/NG in place  Eyes: Anicteric  Neck: Supple, trachea midline  Lungs:  Scattered rhonchi, vent assisted  Heart: S1S2 no rubs  Abdomen:  Soft, nontender, bowel sounds present  Extremities: trace peripheral edema.  Neurologic: Arousable, not following commands  Skin: No lesions  Access:     Basic Metabolic Panel: Recent Labs  Lab 05/04/18 0651 05/05/18 0549 05/05/18 0738 05/07/18 0845 05/08/18 0521  NA 136 134*  --  132* 131*  K 3.4* 3.6  --  3.7 3.7  CL 95* 97*  --  95* 95*  CO2 25 21*  --  20* 20*  GLUCOSE 114* 106*  --  104* 98  BUN 66* 81*  --  60* 73*  CREATININE 7.20* 8.73*  --  7.60* 9.12*  CALCIUM 9.3 9.2 8.9 9.0 8.8*  MG 2.5* 2.7*  --  2.5*  --   PHOS 4.5 4.6  --  4.5 5.2*    Liver Function Tests: Recent Labs  Lab 05/04/18 0651 05/05/18 0549 05/07/18 0845 05/08/18 0521  AST 28  --   --   --   ALT 30  --   --   --   ALKPHOS 89  --   --   --   BILITOT 0.6  --   --   --   PROT 7.5  --   --   --   ALBUMIN 2.3* 2.3* 2.2* 2.3*   No results for input(s): LIPASE, AMYLASE in the last 168 hours. No results for input(s): AMMONIA in the last 168 hours.  CBC: Recent Labs  Lab 05/04/18 0651 05/05/18 0549 05/07/18 0845 05/08/18 0521  WBC 12.5* 10.7* 8.2 8.7  NEUTROABS 10.5*  --   --   --   HGB 8.1* 8.5* 8.2* 7.6*  HCT 25.8* 26.3* 25.5* 24.0*  MCV 105.3* 104.8* 104.9* 104.8*  PLT 347 390 320 362    Cardiac Enzymes: No results for input(s): CKTOTAL, CKMB, CKMBINDEX, TROPONINI in the last 168 hours.  BNP: Invalid input(s): POCBNP  CBG: No results for input(s): GLUCAP in the last 168  hours.  Microbiology: Results for orders placed or performed during the hospital encounter of 05/03/18  C difficile quick scan w PCR reflex     Status: Abnormal   Collection Time: 05/03/18  7:40 PM  Result Value Ref Range Status   C Diff antigen POSITIVE (A) NEGATIVE Final   C Diff toxin NEGATIVE NEGATIVE Final   C Diff interpretation Results are indeterminate. See PCR results.  Final    Comment: Performed at [facility], [place] [address] [place], [place] [address]  C. Diff by PCR, Reflexed     Status: Abnormal   Collection Time: 05/03/18  7:40 PM  Result Value Ref Range Status   Toxigenic C. Difficile by PCR POSITIVE (A) NEGATIVE Final    Comment: Positive for toxigenic C. difficile with little to no toxin production. Only treat if clinical presentation suggests symptomatic illness. Performed at [facility], [place] [address] [place], [place] [address]   Expectorated sputum assessment w rflx to resp cult     Status: None   Collection Time: 05/04/18  9:21 AM  Result Value Ref Range Status   Specimen Description EXPECTORATED SPUTUM  Final   Special Requests NONE  Final   Sputum evaluation   Final    THIS SPECIMEN IS ACCEPTABLE FOR SPUTUM CULTURE Performed at [facility], [address], [place], [place] [address]    Report Status 05/04/2018 FINAL  Final  Culture, respiratory     Status: None   Collection Time: 05/04/18  9:21 AM  Result Value Ref Range Status   Specimen Description EXPECTORATED SPUTUM  Final   Special Requests NONE Reflexed from [id number]  Final   Gram Stain   Final    ABUNDANT WBC PRESENT, PREDOMINANTLY PMN RARE SQUAMOUS EPITHELIAL CELLS PRESENT NO ORGANISMS SEEN    Culture   Final    FEW Consistent with normal respiratory flora. Performed at [facility], [place] [address] [place], [place] [address]    Report Status 05/06/2018 FINAL  Final    Coagulation Studies: No results for input(s): LABPROT, INR in the last 72  hours.  Urinalysis: No results for input(s): COLORURINE, LABSPEC, PHURINE, GLUCOSEU, HGBUR, BILIRUBINUR, KETONESUR, PROTEINUR, UROBILINOGEN, NITRITE, LEUKOCYTESUR in the last 72 hours.  Invalid input(s): APPERANCEUR    Imaging: No results found.   Medications:       Assessment/ Plan:  73 y.o. male with a PMHx of COPD, end-stage renal disease, hypertension, hepatitis C, cirrhosis, chronic systolic heart failure, depression, anemia of chronic kidney disease, secondary hyperparathyroidism, seizure disorder, who was admitted to [facility] on 05/03/2018 for ongoing treatment of respiratory failure and end-stage renal disease.   1.  ESRD on HD.    Patient seen and evaluated during dialysis treatment.  We plan to complete dialysis treatment and schedule the patient for dialysis again on Wednesday.  2.  Acute respiratory failure.    Patient maintained on ventilatory support at this point in time.  3.  Anemia of chronic kidney disease.    Hemoglobin is dropped to 7.6.  Maintain the patient on reticulocyte rate.  Consider blood transfusion for hemoglobin of 7 or less.  4.  Secondary hyperparathyroidism.    Phosphorus 5.2 and currently acceptable.  Continue to monitor bone metabolism parameters.   LOS: 0 Dontrez Pettis 3/9/20208:46 AM

## 2018-05-09 DIAGNOSIS — J9621 Acute and chronic respiratory failure with hypoxia: Secondary | ICD-10-CM | POA: Diagnosis not present

## 2018-05-09 DIAGNOSIS — I5022 Chronic systolic (congestive) heart failure: Secondary | ICD-10-CM | POA: Diagnosis not present

## 2018-05-09 DIAGNOSIS — N186 End stage renal disease: Secondary | ICD-10-CM | POA: Diagnosis not present

## 2018-05-09 DIAGNOSIS — J181 Lobar pneumonia, unspecified organism: Secondary | ICD-10-CM | POA: Diagnosis not present

## 2018-05-09 NOTE — Progress Notes (Addendum)
Pulmonary Critical Care Medicine [place]   PULMONARY CRITICAL CARE SERVICE  PROGRESS NOTE  Date of Service: 05/09/2018  Jared Hancock  PFX:[id number]  DOB: 08-13-1945   DOA: 05/03/2018  Referring Physician: Merton Border, MD  HPI: Jared Hancock is a 73 y.o. male seen for follow up of Acute on Chronic Respiratory Failure.  Patient was able to do 14 hours on pressure support yesterday 12/5 with FiO2 of 28%.  The goal today is 16 hours.  Patient appears to be doing well at this time and will continue.  Medications: Reviewed on Rounds  Physical Exam:  Vitals: Pulse 60 respirations 26 BP 109/86 O2 sat 100% temp 98.0  Ventilator Settings not currently on ventilator  . General: Comfortable at this time . Eyes: Grossly normal lids, irises & conjunctiva . ENT: grossly tongue is normal . Neck: no obvious mass . Cardiovascular: S1 S2 normal no gallop . Respiratory: Coarse breath sounds . Abdomen: soft . Skin: no rash seen on limited exam . Musculoskeletal: not rigid . Psychiatric:unable to assess . Neurologic: no seizure no involuntary movements         Lab Data:   Basic Metabolic Panel: Recent Labs  Lab 05/04/18 0651 05/05/18 0549 05/05/18 0738 05/07/18 0845 05/08/18 0521  NA 136 134*  --  132* 131*  K 3.4* 3.6  --  3.7 3.7  CL 95* 97*  --  95* 95*  CO2 25 21*  --  20* 20*  GLUCOSE 114* 106*  --  104* 98  BUN 66* 81*  --  60* 73*  CREATININE 7.20* 8.73*  --  7.60* 9.12*  CALCIUM 9.3 9.2 8.9 9.0 8.8*  MG 2.5* 2.7*  --  2.5*  --   PHOS 4.5 4.6  --  4.5 5.2*    ABG: Recent Labs  Lab 05/03/18 1740  PHART 7.437  PCO2ART 40.7  PO2ART 79.7*  HCO3 27.0  O2SAT 96.3    Liver Function Tests: Recent Labs  Lab 05/04/18 0651 05/05/18 0549 05/07/18 0845 05/08/18 0521  AST 28  --   --   --   ALT 30  --   --   --   ALKPHOS 89  --   --   --   BILITOT 0.6  --   --   --   PROT 7.5  --   --   --   ALBUMIN 2.3* 2.3* 2.2* 2.3*   No results for  input(s): LIPASE, AMYLASE in the last 168 hours. No results for input(s): AMMONIA in the last 168 hours.  CBC: Recent Labs  Lab 05/04/18 0651 05/05/18 0549 05/07/18 0845 05/08/18 0521  WBC 12.5* 10.7* 8.2 8.7  NEUTROABS 10.5*  --   --   --   HGB 8.1* 8.5* 8.2* 7.6*  HCT 25.8* 26.3* 25.5* 24.0*  MCV 105.3* 104.8* 104.9* 104.8*  PLT 347 390 320 362    Cardiac Enzymes: No results for input(s): CKTOTAL, CKMB, CKMBINDEX, TROPONINI in the last 168 hours.  BNP (last 3 results) No results for input(s): BNP in the last 8760 hours.  ProBNP (last 3 results) No results for input(s): PROBNP in the last 8760 hours.  Radiological Exams: No results found.  Assessment/Plan Active Problems:   Acute on chronic respiratory failure with hypoxia (HCC)   Seizure disorder (HCC)   End stage renal disease on dialysis ([place])   Lobar pneumonia (HCC)   Chronic systolic heart failure ([place])   1. Acute on chronic respiratory failure  with hypoxia patient will continue with pressure support weaning.  Continue aggressive pulmonary toilet and supportive measures. 2. Seizure disorder no active seizures noted 3. End-stage renal disease on dialysis continue with supportive care 4. Lobar pneumonia treated continue present management 5. Chronic systolic heart failure at baseline   I have personally seen and evaluated the patient, evaluated laboratory and imaging results, formulated the assessment and plan and placed orders. The Patient requires high complexity decision making for assessment and support.  Case was discussed on Rounds with the Respiratory Therapy Staff  Allyne Gee, MD [facility] Pulmonary Critical Care Medicine Sleep Medicine

## 2018-05-10 ENCOUNTER — Encounter (HOSPITAL_BASED_OUTPATIENT_CLINIC_OR_DEPARTMENT_OTHER): Payer: Medicaid Other

## 2018-05-10 DIAGNOSIS — J9621 Acute and chronic respiratory failure with hypoxia: Secondary | ICD-10-CM | POA: Diagnosis not present

## 2018-05-10 DIAGNOSIS — J181 Lobar pneumonia, unspecified organism: Secondary | ICD-10-CM

## 2018-05-10 DIAGNOSIS — I5022 Chronic systolic (congestive) heart failure: Secondary | ICD-10-CM | POA: Diagnosis not present

## 2018-05-10 DIAGNOSIS — Z992 Dependence on renal dialysis: Secondary | ICD-10-CM

## 2018-05-10 DIAGNOSIS — N186 End stage renal disease: Secondary | ICD-10-CM

## 2018-05-10 DIAGNOSIS — G40909 Epilepsy, unspecified, not intractable, without status epilepticus: Secondary | ICD-10-CM

## 2018-05-10 LAB — CBC
HCT: 24.8 % — ABNORMAL LOW (ref 39.0–52.0)
Hemoglobin: 8 g/dL — ABNORMAL LOW (ref 13.0–17.0)
MCH: 33.6 pg (ref 26.0–34.0)
MCHC: 32.3 g/dL (ref 30.0–36.0)
MCV: 104.2 fL — ABNORMAL HIGH (ref 80.0–100.0)
Platelets: 268 10*3/uL (ref 150–400)
RBC: 2.38 MIL/uL — ABNORMAL LOW (ref 4.22–5.81)
RDW: 17.2 % — ABNORMAL HIGH (ref 11.5–15.5)
WBC: 9.6 10*3/uL (ref 4.0–10.5)
nRBC: 0 % (ref 0.0–0.2)

## 2018-05-10 LAB — RENAL FUNCTION PANEL
Albumin: 2.3 g/dL — ABNORMAL LOW (ref 3.5–5.0)
Anion gap: 16 — ABNORMAL HIGH (ref 5–15)
BUN: 57 mg/dL — ABNORMAL HIGH (ref 8–23)
CHLORIDE: 92 mmol/L — AB (ref 98–111)
CO2: 23 mmol/L (ref 22–32)
Calcium: 9.2 mg/dL (ref 8.9–10.3)
Creatinine, Ser: 7.77 mg/dL — ABNORMAL HIGH (ref 0.61–1.24)
GFR calc Af Amer: 7 mL/min — ABNORMAL LOW (ref >60)
GFR calc non Af Amer: 6 mL/min — ABNORMAL LOW (ref >60)
GLUCOSE: 100 mg/dL — AB (ref 70–99)
Phosphorus: 5.1 mg/dL — ABNORMAL HIGH (ref 2.5–4.6)
Potassium: 4.3 mmol/L (ref 3.5–5.1)
Sodium: 131 mmol/L — ABNORMAL LOW (ref 135–145)

## 2018-05-10 NOTE — Progress Notes (Signed)
[facility]  ROUNDING NOTE   Subjective:  Patient just completed hemodialysis. Currently awake and alert. He will follow commands today.    Objective:  Vital signs in last 24 hours:  Temperature 96.9 pulse 59 respirations 22 blood pressure 164/62  Physical Exam: General: Critically ill-appearing  Head: ETT/NG in place  Eyes: Anicteric  Neck: Supple, trachea midline  Lungs:  Scattered rhonchi, vent assisted  Heart: S1S2 no rubs  Abdomen:  Soft, nontender, bowel sounds present  Extremities: trace peripheral edema.  Neurologic: Awake, alert, follows commands  Skin: No lesions  Access:     Basic Metabolic Panel: Recent Labs  Lab 05/04/18 0651 05/05/18 0549  05/07/18 0845 05/08/18 0521 05/10/18 0522  NA 136 134*  --  132* 131* 131*  K 3.4* 3.6  --  3.7 3.7 4.3  CL 95* 97*  --  95* 95* 92*  CO2 25 21*  --  20* 20* 23  GLUCOSE 114* 106*  --  104* 98 100*  BUN 66* 81*  --  60* 73* 57*  CREATININE 7.20* 8.73*  --  7.60* 9.12* 7.77*  CALCIUM 9.3 9.2   < > 9.0 8.8* 9.2  MG 2.5* 2.7*  --  2.5*  --   --   PHOS 4.5 4.6  --  4.5 5.2* 5.1*   < > = values in this interval not displayed.    Liver Function Tests: Recent Labs  Lab 05/04/18 2956 05/05/18 0549 05/07/18 0845 05/08/18 0521 05/10/18 0522  AST 28  --   --   --   --   ALT 30  --   --   --   --   ALKPHOS 89  --   --   --   --   BILITOT 0.6  --   --   --   --   PROT 7.5  --   --   --   --   ALBUMIN 2.3* 2.3* 2.2* 2.3* 2.3*   No results for input(s): LIPASE, AMYLASE in the last 168 hours. No results for input(s): AMMONIA in the last 168 hours.  CBC: Recent Labs  Lab 05/04/18 0651 05/05/18 0549 05/07/18 0845 05/08/18 0521 05/10/18 0522  WBC 12.5* 10.7* 8.2 8.7 9.6  NEUTROABS 10.5*  --   --   --   --   HGB 8.1* 8.5* 8.2* 7.6* 8.0*  HCT 25.8* 26.3* 25.5* 24.0* 24.8*  MCV 105.3* 104.8* 104.9* 104.8* 104.2*  PLT 347 390 320 362 268    Cardiac Enzymes: No results for input(s): CKTOTAL,  CKMB, CKMBINDEX, TROPONINI in the last 168 hours.  BNP: Invalid input(s): POCBNP  CBG: No results for input(s): GLUCAP in the last 168 hours.  Microbiology: Results for orders placed or performed during the hospital encounter of 05/03/18  C difficile quick scan w PCR reflex     Status: Abnormal   Collection Time: 05/03/18  7:40 PM  Result Value Ref Range Status   C Diff antigen POSITIVE (A) NEGATIVE Final   C Diff toxin NEGATIVE NEGATIVE Final   C Diff interpretation Results are indeterminate. See PCR results.  Final    Comment: Performed at [facility], [place] [address], [place], [place] [address]  C. Diff by PCR, Reflexed     Status: Abnormal   Collection Time: 05/03/18  7:40 PM  Result Value Ref Range Status   Toxigenic C. Difficile by PCR POSITIVE (A) NEGATIVE Final    Comment: Positive for toxigenic C. difficile  with little to no toxin production. Only treat if clinical presentation suggests symptomatic illness. Performed at [facility], [place] [address], [place], [place] [address]   Expectorated sputum assessment w rflx to resp cult     Status: None   Collection Time: 05/04/18  9:21 AM  Result Value Ref Range Status   Specimen Description EXPECTORATED SPUTUM  Final   Special Requests NONE  Final   Sputum evaluation   Final    THIS SPECIMEN IS ACCEPTABLE FOR SPUTUM CULTURE Performed at [facility], [place] [address] [place], [place] [address]    Report Status 05/04/2018 FINAL  Final  Culture, respiratory     Status: None   Collection Time: 05/04/18  9:21 AM  Result Value Ref Range Status   Specimen Description EXPECTORATED SPUTUM  Final   Special Requests NONE Reflexed from [id number]  Final   Gram Stain   Final    ABUNDANT WBC PRESENT, PREDOMINANTLY PMN RARE SQUAMOUS EPITHELIAL CELLS PRESENT NO ORGANISMS SEEN    Culture   Final    FEW Consistent with normal respiratory flora. Performed at [facility], [place] [address], [place], [place] [address]     Report Status 05/06/2018 FINAL  Final    Coagulation Studies: No results for input(s): LABPROT, INR in the last 72 hours.  Urinalysis: No results for input(s): COLORURINE, LABSPEC, PHURINE, GLUCOSEU, HGBUR, BILIRUBINUR, KETONESUR, PROTEINUR, UROBILINOGEN, NITRITE, LEUKOCYTESUR in the last 72 hours.  Invalid input(s): APPERANCEUR    Imaging: No results found.   Medications:       Assessment/ Plan:  73 y.o. male with a PMHx of COPD, end-stage renal disease, hypertension, hepatitis C, cirrhosis, chronic systolic heart failure, depression, anemia of chronic kidney disease, secondary hyperparathyroidism, seizure disorder, who was admitted to [facility] on 05/03/2018 for ongoing treatment of respiratory failure and end-stage renal disease.   1.  ESRD on HD.    Patient completed hemodialysis today.  Next Alysis treatment for Friday.  2.  Acute respiratory failure.    Patient still on the ventilator.  Hopefully he can be weaned today or tomorrow.  3.  Anemia of chronic kidney disease.    Continue Retacrit.  Hemoglobin up to 8.0.  4.  Secondary hyperparathyroidism.    Phosphorus 5.1 and at target.   LOS: 0 Adalai Perl 3/11/202011:14 AM

## 2018-05-10 NOTE — Progress Notes (Addendum)
Pulmonary Critical Care Medicine [place]   PULMONARY CRITICAL CARE SERVICE  PROGRESS NOTE  Date of Service: 05/10/2018  Jared Hancock  OQH:[id number]  DOB: 11-Sep-1945   DOA: 05/03/2018  Referring Physician: Merton Border, MD  HPI: Jared Hancock is a 73 y.o. male seen for follow up of Acute on Chronic Respiratory Failure.  Patient is currently on PSV 12/5 with an FiO2 of 28%.  Goal today is 24 hours which he is currently doing well with.  He does continue to have some secretions however they are manageable at this time per respiratory.  Medications: Reviewed on Rounds  Physical Exam:  Vitals: Pulse 59 respirations 22 BP 164/62 O2 sat 97% temp 96.9  Ventilator Settings pressure support mode 12/5 FiO2 28%  . General: Comfortable at this time . Eyes: Grossly normal lids, irises & conjunctiva . ENT: grossly tongue is normal . Neck: no obvious mass . Cardiovascular: S1 S2 normal no gallop . Respiratory: Coarse breath sounds . Abdomen: soft . Skin: no rash seen on limited exam . Musculoskeletal: not rigid . Psychiatric:unable to assess . Neurologic: no seizure no involuntary movements         Lab Data:   Basic Metabolic Panel: Recent Labs  Lab 05/04/18 0651 05/05/18 0549 05/05/18 0738 05/07/18 0845 05/08/18 0521 05/10/18 0522  NA 136 134*  --  132* 131* 131*  K 3.4* 3.6  --  3.7 3.7 4.3  CL 95* 97*  --  95* 95* 92*  CO2 25 21*  --  20* 20* 23  GLUCOSE 114* 106*  --  104* 98 100*  BUN 66* 81*  --  60* 73* 57*  CREATININE 7.20* 8.73*  --  7.60* 9.12* 7.77*  CALCIUM 9.3 9.2 8.9 9.0 8.8* 9.2  MG 2.5* 2.7*  --  2.5*  --   --   PHOS 4.5 4.6  --  4.5 5.2* 5.1*    ABG: Recent Labs  Lab 05/03/18 1740  PHART 7.437  PCO2ART 40.7  PO2ART 79.7*  HCO3 27.0  O2SAT 96.3    Liver Function Tests: Recent Labs  Lab 05/04/18 0651 05/05/18 0549 05/07/18 0845 05/08/18 0521 05/10/18 0522  AST 28  --   --   --   --   ALT 30  --   --   --   --   ALKPHOS  89  --   --   --   --   BILITOT 0.6  --   --   --   --   PROT 7.5  --   --   --   --   ALBUMIN 2.3* 2.3* 2.2* 2.3* 2.3*   No results for input(s): LIPASE, AMYLASE in the last 168 hours. No results for input(s): AMMONIA in the last 168 hours.  CBC: Recent Labs  Lab 05/04/18 0651 05/05/18 0549 05/07/18 0845 05/08/18 0521 05/10/18 0522  WBC 12.5* 10.7* 8.2 8.7 9.6  NEUTROABS 10.5*  --   --   --   --   HGB 8.1* 8.5* 8.2* 7.6* 8.0*  HCT 25.8* 26.3* 25.5* 24.0* 24.8*  MCV 105.3* 104.8* 104.9* 104.8* 104.2*  PLT 347 390 320 362 268    Cardiac Enzymes: No results for input(s): CKTOTAL, CKMB, CKMBINDEX, TROPONINI in the last 168 hours.  BNP (last 3 results) No results for input(s): BNP in the last 8760 hours.  ProBNP (last 3 results) No results for input(s): PROBNP in the last 8760 hours.  Radiological Exams: Vas [place]  Upper Extremity Venous Duplex  Result Date: 05/10/2018 UPPER VENOUS STUDY  Indications: Swelling Limitations: Bandages. Performing Technologist: Abram Sander RVS  Examination Guidelines: A complete evaluation includes B-mode imaging, spectral Doppler, color Doppler, and power Doppler as needed of all accessible portions of each vessel. Bilateral testing is considered an integral part of a complete examination. Limited examinations for reoccurring indications may be performed as noted.  Right Findings: +----------+------------+---------+-----------+----------+--------------+ RIGHT     CompressiblePhasicitySpontaneousProperties   Summary     +----------+------------+---------+-----------+----------+--------------+ IJV                      Yes       Yes                             +----------+------------+---------+-----------+----------+--------------+ Subclavian                                          Not visualized +----------+------------+---------+-----------+----------+--------------+ Axillary      Full       Yes       Yes                              +----------+------------+---------+-----------+----------+--------------+ Brachial      Full       Yes       Yes                             +----------+------------+---------+-----------+----------+--------------+ Radial        Full                                                 +----------+------------+---------+-----------+----------+--------------+ Ulnar         Full                                                 +----------+------------+---------+-----------+----------+--------------+ Cephalic      Full                                                 +----------+------------+---------+-----------+----------+--------------+ Basilic       Full                                                 +----------+------------+---------+-----------+----------+--------------+  Summary:  Right: No evidence of deep vein thrombosis in the upper extremity. No evidence of superficial vein thrombosis in the upper extremity.  *See table(s) above for measurements and observations.    Preliminary     Assessment/Plan Active Problems:   Acute on chronic respiratory failure with hypoxia (HCC)   Seizure disorder (HCC)   End stage renal disease on dialysis ([place])   Lobar pneumonia (HCC)   Chronic systolic heart failure ([place])  1. Acute on chronic respiratory failure with hypoxia patient will continue with pressure support weaning continue aggressive pulmonary toilet and supportive measures 2. Seizure disorder no active seizures noted 3. End-stage renal disease on dialysis continue supportive care 4. Lobar pneumonia treated continue present management 5. Chronic systolic heart failure at baseline   I have personally seen and evaluated the patient, evaluated laboratory and imaging results, formulated the assessment and plan and placed orders. The Patient requires high complexity decision making for assessment and support.  Case was discussed on Rounds with the Respiratory Therapy  Staff  Allyne Gee, MD [facility] Pulmonary Critical Care Medicine Sleep Medicine

## 2018-05-10 NOTE — Progress Notes (Signed)
Upper extremity venous has been completed.   Preliminary results in CV Proc.   Abram Sander 05/10/2018 1:03 PM

## 2018-05-11 ENCOUNTER — Other Ambulatory Visit (HOSPITAL_COMMUNITY): Payer: Medicaid Other

## 2018-05-11 DIAGNOSIS — J9621 Acute and chronic respiratory failure with hypoxia: Secondary | ICD-10-CM | POA: Diagnosis not present

## 2018-05-11 DIAGNOSIS — J181 Lobar pneumonia, unspecified organism: Secondary | ICD-10-CM | POA: Diagnosis not present

## 2018-05-11 DIAGNOSIS — I5022 Chronic systolic (congestive) heart failure: Secondary | ICD-10-CM | POA: Diagnosis not present

## 2018-05-11 DIAGNOSIS — N186 End stage renal disease: Secondary | ICD-10-CM | POA: Diagnosis not present

## 2018-05-11 NOTE — Progress Notes (Signed)
Pulmonary Critical Care Medicine [place]   PULMONARY CRITICAL CARE SERVICE  PROGRESS NOTE  Date of Service: 05/11/2018  Jared Hancock  NGE:[id number]  DOB: [birth year]   DOA: 05/03/2018  Referring Physician: Merton Border, MD  HPI: Jared Hancock is a 73 y.o. male seen for follow up of Acute on Chronic Respiratory Failure.  Patient remains orally intubated currently is on pressure support mode has been on 28% oxygen  Medications: Reviewed on Rounds  Physical Exam:  Vitals: Temperature 98.2 pulse 62 respiratory rate 19 blood pressure 165/66 saturations 99%  Ventilator Settings mode ventilation pressure support FiO2 28% tidal volume 450 pressure support 12 PEEP 5  . General: Comfortable at this time . Eyes: Grossly normal lids, irises & conjunctiva . ENT: grossly tongue is normal . Neck: no obvious mass . Cardiovascular: S1 S2 normal no gallop . Respiratory: No rhonchi rales are noted at this time . Abdomen: soft . Skin: no rash seen on limited exam . Musculoskeletal: not rigid . Psychiatric:unable to assess . Neurologic: no seizure no involuntary movements         Lab Data:   Basic Metabolic Panel: Recent Labs  Lab 05/05/18 0549 05/05/18 0738 05/07/18 0845 05/08/18 0521 05/10/18 0522  NA 134*  --  132* 131* 131*  K 3.6  --  3.7 3.7 4.3  CL 97*  --  95* 95* 92*  CO2 21*  --  20* 20* 23  GLUCOSE 106*  --  104* 98 100*  BUN 81*  --  60* 73* 57*  CREATININE 8.73*  --  7.60* 9.12* 7.77*  CALCIUM 9.2 8.9 9.0 8.8* 9.2  MG 2.7*  --  2.5*  --   --   PHOS 4.6  --  4.5 5.2* 5.1*    ABG: No results for input(s): PHART, PCO2ART, PO2ART, HCO3, O2SAT in the last 168 hours.  Liver Function Tests: Recent Labs  Lab 05/05/18 0549 05/07/18 0845 05/08/18 0521 05/10/18 0522  ALBUMIN 2.3* 2.2* 2.3* 2.3*   No results for input(s): LIPASE, AMYLASE in the last 168 hours. No results for input(s): AMMONIA in the last 168 hours.  CBC: Recent Labs  Lab  05/05/18 0549 05/07/18 0845 05/08/18 0521 05/10/18 0522  WBC 10.7* 8.2 8.7 9.6  HGB 8.5* 8.2* 7.6* 8.0*  HCT 26.3* 25.5* 24.0* 24.8*  MCV 104.8* 104.9* 104.8* 104.2*  PLT 390 320 362 268    Cardiac Enzymes: No results for input(s): CKTOTAL, CKMB, CKMBINDEX, TROPONINI in the last 168 hours.  BNP (last 3 results) No results for input(s): BNP in the last 8760 hours.  ProBNP (last 3 results) No results for input(s): PROBNP in the last 8760 hours.  Radiological Exams: Dg Chest Port 1 View  Result Date: 05/11/2018 CLINICAL DATA:  Fever.  End-stage renal disease. EXAM: PORTABLE CHEST 1 VIEW COMPARISON:  One-view chest x-ray 05/04/2018 FINDINGS: The heart size is normal. Endotracheal tube is in stable position. NG tube courses off the inferior border the film. Right IJ dialysis catheter is stable. There is improved aeration at the left base. No new airspace disease is present. IMPRESSION: 1. Stable and satisfactory positioning support apparatus. 2. Improving aeration at the left base. Electronically Signed   By: San Morelle M.D.   On: 05/11/2018 13:37   Vas [place] Upper Extremity Venous Duplex  Result Date: 05/10/2018 UPPER VENOUS STUDY  Indications: Swelling Limitations: Bandages. Performing Technologist: Abram Sander RVS  Examination Guidelines: A complete evaluation includes B-mode imaging, spectral Doppler, color  Doppler, and power Doppler as needed of all accessible portions of each vessel. Bilateral testing is considered an integral part of a complete examination. Limited examinations for reoccurring indications may be performed as noted.  Right Findings: +----------+------------+---------+-----------+----------+--------------+ RIGHT     CompressiblePhasicitySpontaneousProperties   Summary     +----------+------------+---------+-----------+----------+--------------+ IJV                      Yes       Yes                              +----------+------------+---------+-----------+----------+--------------+ Subclavian                                          Not visualized +----------+------------+---------+-----------+----------+--------------+ Axillary      Full       Yes       Yes                             +----------+------------+---------+-----------+----------+--------------+ Brachial      Full       Yes       Yes                             +----------+------------+---------+-----------+----------+--------------+ Radial        Full                                                 +----------+------------+---------+-----------+----------+--------------+ Ulnar         Full                                                 +----------+------------+---------+-----------+----------+--------------+ Cephalic      Full                                                 +----------+------------+---------+-----------+----------+--------------+ Basilic       Full                                                 +----------+------------+---------+-----------+----------+--------------+  Summary:  Right: No evidence of deep vein thrombosis in the upper extremity. No evidence of superficial vein thrombosis in the upper extremity.  *See table(s) above for measurements and observations.    Preliminary     Assessment/Plan Active Problems:   Acute on chronic respiratory failure with hypoxia (HCC)   Seizure disorder (HCC)   End stage renal disease on dialysis ([place])   Lobar pneumonia (HCC)   Chronic systolic heart failure ([place])   1. Acute on chronic respiratory failure with hypoxia continues to wean the goal is 24 hours on pressure support 2. Seizure disorder no active seizures noted at this time 3. End-stage renal  disease on dialysis followed by nephrology continue with supportive care 4. Lobar pneumonia treated clinically improved 5. Chronic systolic heart failure at baseline   I have personally  seen and evaluated the patient, evaluated laboratory and imaging results, formulated the assessment and plan and placed orders. The Patient requires high complexity decision making for assessment and support.  Case was discussed on Rounds with the Respiratory Therapy Staff  Allyne Gee, MD [facility] Pulmonary Critical Care Medicine Sleep Medicine

## 2018-05-12 DIAGNOSIS — J9621 Acute and chronic respiratory failure with hypoxia: Secondary | ICD-10-CM | POA: Diagnosis not present

## 2018-05-12 DIAGNOSIS — I5022 Chronic systolic (congestive) heart failure: Secondary | ICD-10-CM | POA: Diagnosis not present

## 2018-05-12 DIAGNOSIS — N186 End stage renal disease: Secondary | ICD-10-CM | POA: Diagnosis not present

## 2018-05-12 DIAGNOSIS — J181 Lobar pneumonia, unspecified organism: Secondary | ICD-10-CM | POA: Diagnosis not present

## 2018-05-12 LAB — CBC
HCT: 23.5 % — ABNORMAL LOW (ref 39.0–52.0)
Hemoglobin: 7.3 g/dL — ABNORMAL LOW (ref 13.0–17.0)
MCH: 32.9 pg (ref 26.0–34.0)
MCHC: 31.1 g/dL (ref 30.0–36.0)
MCV: 105.9 fL — ABNORMAL HIGH (ref 80.0–100.0)
Platelets: 273 10*3/uL (ref 150–400)
RBC: 2.22 MIL/uL — ABNORMAL LOW (ref 4.22–5.81)
RDW: 17.3 % — ABNORMAL HIGH (ref 11.5–15.5)
WBC: 11.6 10*3/uL — ABNORMAL HIGH (ref 4.0–10.5)
nRBC: 0 % (ref 0.0–0.2)

## 2018-05-12 LAB — RENAL FUNCTION PANEL
Albumin: 2.3 g/dL — ABNORMAL LOW (ref 3.5–5.0)
Anion gap: 15 (ref 5–15)
BUN: 62 mg/dL — AB (ref 8–23)
CO2: 23 mmol/L (ref 22–32)
Calcium: 9.2 mg/dL (ref 8.9–10.3)
Chloride: 92 mmol/L — ABNORMAL LOW (ref 98–111)
Creatinine, Ser: 7.62 mg/dL — ABNORMAL HIGH (ref 0.61–1.24)
GFR calc Af Amer: 8 mL/min — ABNORMAL LOW (ref >60)
GFR calc non Af Amer: 7 mL/min — ABNORMAL LOW (ref >60)
Glucose, Bld: 107 mg/dL — ABNORMAL HIGH (ref 70–99)
Phosphorus: 4.7 mg/dL — ABNORMAL HIGH (ref 2.5–4.6)
Potassium: 4.4 mmol/L (ref 3.5–5.1)
SODIUM: 130 mmol/L — AB (ref 135–145)

## 2018-05-12 LAB — MAGNESIUM: Magnesium: 2.6 mg/dL — ABNORMAL HIGH (ref 1.7–2.4)

## 2018-05-12 NOTE — Progress Notes (Signed)
[facility]  ROUNDING NOTE   Subjective:  Patient seen and evaluated during hemodialysis. Tolerating well. Noted to have a fever. Cultures have been been ordered.  Objective:  Vital signs in last 24 hours:  Temperature 98.6 pulse 74 respirations 18 blood pressure 136/68  Physical Exam: General: Critically ill-appearing  Head: ETT/NG in place  Eyes: Anicteric  Neck: Supple, trachea midline  Lungs:  Scattered rhonchi, vent assisted  Heart: S1S2 no rubs  Abdomen:  Soft, nontender, bowel sounds present  Extremities: trace peripheral edema.  Neurologic: Awake, alert, follows commands  Skin: No lesions  Access: R IJ permcath    Basic Metabolic Panel: Recent Labs  Lab 05/07/18 0845 05/08/18 0521 05/10/18 0522 05/12/18 0622  NA 132* 131* 131* 130*  K 3.7 3.7 4.3 4.4  CL 95* 95* 92* 92*  CO2 20* 20* 23 23  GLUCOSE 104* 98 100* 107*  BUN 60* 73* 57* 62*  CREATININE 7.60* 9.12* 7.77* 7.62*  CALCIUM 9.0 8.8* 9.2 9.2  MG 2.5*  --   --   --   PHOS 4.5 5.2* 5.1* 4.7*    Liver Function Tests: Recent Labs  Lab 05/07/18 0845 05/08/18 0521 05/10/18 0522 05/12/18 0622  ALBUMIN 2.2* 2.3* 2.3* 2.3*   No results for input(s): LIPASE, AMYLASE in the last 168 hours. No results for input(s): AMMONIA in the last 168 hours.  CBC: Recent Labs  Lab 05/07/18 0845 05/08/18 0521 05/10/18 0522 05/12/18 0622  WBC 8.2 8.7 9.6 11.6*  HGB 8.2* 7.6* 8.0* 7.3*  HCT 25.5* 24.0* 24.8* 23.5*  MCV 104.9* 104.8* 104.2* 105.9*  PLT 320 362 268 273    Cardiac Enzymes: No results for input(s): CKTOTAL, CKMB, CKMBINDEX, TROPONINI in the last 168 hours.  BNP: Invalid input(s): POCBNP  CBG: No results for input(s): GLUCAP in the last 168 hours.  Microbiology: Results for orders placed or performed during the hospital encounter of 05/03/18  C difficile quick scan w PCR reflex     Status: Abnormal   Collection Time: 05/03/18  7:40 PM  Result Value Ref Range Status   C  Diff antigen POSITIVE (A) NEGATIVE Final   C Diff toxin NEGATIVE NEGATIVE Final   C Diff interpretation Results are indeterminate. See PCR results.  Final    Comment: Performed at [facility], [place] [address], [place], [place] [address]  C. Diff by PCR, Reflexed     Status: Abnormal   Collection Time: 05/03/18  7:40 PM  Result Value Ref Range Status   Toxigenic C. Difficile by PCR POSITIVE (A) NEGATIVE Final    Comment: Positive for toxigenic C. difficile with little to no toxin production. Only treat if clinical presentation suggests symptomatic illness. Performed at [facility], [place] [address] [place], [place] [address]   Expectorated sputum assessment w rflx to resp cult     Status: None   Collection Time: 05/04/18  9:21 AM  Result Value Ref Range Status   Specimen Description EXPECTORATED SPUTUM  Final   Special Requests NONE  Final   Sputum evaluation   Final    THIS SPECIMEN IS ACCEPTABLE FOR SPUTUM CULTURE Performed at [facility], [place] [address], [place], [place] [address]    Report Status 05/04/2018 FINAL  Final  Culture, respiratory     Status: None   Collection Time: 05/04/18  9:21 AM  Result Value Ref Range Status   Specimen Description EXPECTORATED SPUTUM  Final   Special Requests NONE Reflexed  from [id number]  Final   Gram Stain   Final    ABUNDANT WBC PRESENT, PREDOMINANTLY PMN RARE SQUAMOUS EPITHELIAL CELLS PRESENT NO ORGANISMS SEEN    Culture   Final    FEW Consistent with normal respiratory flora. Performed at [facility], [place] [address], [place], [place] [address]    Report Status 05/06/2018 FINAL  Final  Culture, respiratory     Status: None (Preliminary result)   Collection Time: 05/11/18  4:20 PM  Result Value Ref Range Status   Specimen Description SPUTUM  Final   Special Requests NONE  Final   Gram Stain   Final    ABUNDANT WBC PRESENT,BOTH PMN AND MONONUCLEAR NO ORGANISMS SEEN Performed at [facility], [address] [place], [place] [address]    Culture PENDING  Incomplete   Report Status PENDING  Incomplete    Coagulation Studies: No results for input(s): LABPROT, INR in the last 72 hours.  Urinalysis: No results for input(s): COLORURINE, LABSPEC, PHURINE, GLUCOSEU, HGBUR, BILIRUBINUR, KETONESUR, PROTEINUR, UROBILINOGEN, NITRITE, LEUKOCYTESUR in the last 72 hours.  Invalid input(s): APPERANCEUR    Imaging: Dg Chest Port 1 View  Result Date: 05/11/2018 CLINICAL DATA:  Fever.  End-stage renal disease. EXAM: PORTABLE CHEST 1 VIEW COMPARISON:  One-view chest x-ray 05/04/2018 FINDINGS: The heart size is normal. Endotracheal tube is in stable position. NG tube courses off the inferior border the film. Right IJ dialysis catheter is stable. There is improved aeration at the left base. No new airspace disease is present. IMPRESSION: 1. Stable and satisfactory positioning support apparatus. 2. Improving aeration at the left base. Electronically Signed   By: San Morelle M.D.   On: 05/11/2018 13:37   Vas [place] Upper Extremity Venous Duplex  Result Date: 05/10/2018 UPPER VENOUS STUDY  Indications: Swelling Limitations: Bandages. Performing Technologist: Abram Sander RVS  Examination Guidelines: A complete evaluation includes B-mode imaging, spectral Doppler, color Doppler, and power Doppler as needed of all accessible portions of each vessel. Bilateral testing is considered an integral part of a complete examination. Limited examinations for reoccurring indications may be performed as noted.  Right Findings: +----------+------------+---------+-----------+----------+--------------+ RIGHT     CompressiblePhasicitySpontaneousProperties   Summary     +----------+------------+---------+-----------+----------+--------------+ IJV                      Yes       Yes                             +----------+------------+---------+-----------+----------+--------------+ Subclavian                                           Not visualized +----------+------------+---------+-----------+----------+--------------+ Axillary      Full       Yes       Yes                             +----------+------------+---------+-----------+----------+--------------+ Brachial      Full       Yes       Yes                             +----------+------------+---------+-----------+----------+--------------+ Radial        Full                                                 +----------+------------+---------+-----------+----------+--------------+  Ulnar         Full                                                 +----------+------------+---------+-----------+----------+--------------+ Cephalic      Full                                                 +----------+------------+---------+-----------+----------+--------------+ Basilic       Full                                                 +----------+------------+---------+-----------+----------+--------------+  Summary:  Right: No evidence of deep vein thrombosis in the upper extremity. No evidence of superficial vein thrombosis in the upper extremity.  *See table(s) above for measurements and observations.    Preliminary      Medications:       Assessment/ Plan:  73 y.o. male with a PMHx of COPD, end-stage renal disease, hypertension, hepatitis C, cirrhosis, chronic systolic heart failure, depression, anemia of chronic kidney disease, secondary hyperparathyroidism, seizure disorder, who was admitted to [facility] on 05/03/2018 for ongoing treatment of respiratory failure and end-stage renal disease.   1.  ESRD on HD.    Seen and evaluated during hemodialysis.  Tolerating well.  We will plan to complete dialysis treatment today and next treatment will be on Monday.  2.  Acute respiratory failure.    Continue current ventilatory support.  3.  Anemia of chronic kidney disease.    Hemoglobin down further to 7.3.  Consider blood  transfusion if hemoglobin drops to 7 or below.  4.  Secondary hyperparathyroidism.    Phosphorus rechecked today and was found to be 4.7 which is acceptable.  Continue to monitor bone mineral metabolism parameters.   LOS: 0 Helmut Hennon 3/13/20208:34 AM

## 2018-05-12 NOTE — Progress Notes (Signed)
Pulmonary Critical Care Medicine [place]   PULMONARY CRITICAL CARE SERVICE  PROGRESS NOTE  Date of Service: 05/12/2018  [name]  ETK:[id number]  DOB: [birth year]   DOA: 05/03/2018  Referring Physician: Merton Border, MD  HPI: Jared Hancock is a 73 y.o. male seen for follow up of Acute on Chronic Respiratory Failure.  Patient is on pressure support mode right now remains orally intubated.  Has been tolerating pressure support fairly well however has copious amounts of secretions and not able to handle the secretions awaiting tracheostomy  Medications: Reviewed on Rounds  Physical Exam:  Vitals: Temperature 98.6 pulse 74 respiratory 18 blood pressure 136/68 saturations 100%  Ventilator Settings mode ventilation pressure support FiO2 28% tidal line 625 pressure poor 12 PEEP 5  . General: Comfortable at this time . Eyes: Grossly normal lids, irises & conjunctiva . ENT: grossly tongue is normal . Neck: no obvious mass . Cardiovascular: S1 S2 normal no gallop . Respiratory: Scattered rhonchi expansion equal . Abdomen: soft . Skin: no rash seen on limited exam . Musculoskeletal: not rigid . Psychiatric:unable to assess . Neurologic: no seizure no involuntary movements         Lab Data:   Basic Metabolic Panel: Recent Labs  Lab 05/07/18 0845 05/08/18 0521 05/10/18 0522 05/12/18 0622  NA 132* 131* 131* 130*  K 3.7 3.7 4.3 4.4  CL 95* 95* 92* 92*  CO2 20* 20* 23 23  GLUCOSE 104* 98 100* 107*  BUN 60* 73* 57* 62*  CREATININE 7.60* 9.12* 7.77* 7.62*  CALCIUM 9.0 8.8* 9.2 9.2  MG 2.5*  --   --  2.6*  PHOS 4.5 5.2* 5.1* 4.7*    ABG: No results for input(s): PHART, PCO2ART, PO2ART, HCO3, O2SAT in the last 168 hours.  Liver Function Tests: Recent Labs  Lab 05/07/18 0845 05/08/18 0521 05/10/18 0522 05/12/18 0622  ALBUMIN 2.2* 2.3* 2.3* 2.3*   No results for input(s): LIPASE, AMYLASE in the last 168 hours. No results for input(s): AMMONIA in  the last 168 hours.  CBC: Recent Labs  Lab 05/07/18 0845 05/08/18 0521 05/10/18 0522 05/12/18 0622  WBC 8.2 8.7 9.6 11.6*  HGB 8.2* 7.6* 8.0* 7.3*  HCT 25.5* 24.0* 24.8* 23.5*  MCV 104.9* 104.8* 104.2* 105.9*  PLT 320 362 268 273    Cardiac Enzymes: No results for input(s): CKTOTAL, CKMB, CKMBINDEX, TROPONINI in the last 168 hours.  BNP (last 3 results) No results for input(s): BNP in the last 8760 hours.  ProBNP (last 3 results) No results for input(s): PROBNP in the last 8760 hours.  Radiological Exams: Dg Chest Port 1 View  Result Date: 05/11/2018 CLINICAL DATA:  Fever.  End-stage renal disease. EXAM: PORTABLE CHEST 1 VIEW COMPARISON:  One-view chest x-ray 05/04/2018 FINDINGS: The heart size is normal. Endotracheal tube is in stable position. NG tube courses off the inferior border the film. Right IJ dialysis catheter is stable. There is improved aeration at the left base. No new airspace disease is present. IMPRESSION: 1. Stable and satisfactory positioning support apparatus. 2. Improving aeration at the left base. Electronically Signed   By: San Morelle M.D.   On: 05/11/2018 13:37    Assessment/Plan Active Problems:   Acute on chronic respiratory failure with hypoxia (HCC)   Seizure disorder (HCC)   End stage renal disease on dialysis ([place])   Lobar pneumonia (HCC)   Chronic systolic heart failure ([place])   1. Acute on chronic respiratory failure with hypoxia patient  is on pressure support mode currently on 28% FiO2 with a pressure support of 12/5 patient remains orally intubated requiring tracheostomy which is awaited 2. Seizure disorder no active seizures are noted at this time 3. End-stage renal disease on hemodialysis we will continue with supportive care. 4. Lobar pneumonia treated we will continue present therapy last chest x-ray results reported as above with some improvement 5. Chronic systolic heart failure fluid management per nephrology  recommendations   I have personally seen and evaluated the patient, evaluated laboratory and imaging results, formulated the assessment and plan and placed orders.  Time spent 35 minutes patient is critically ill in danger of cardiac arrest and death has an oral endotracheal tube in place high risk for dislodgment The Patient requires high complexity decision making for assessment and support.  Case was discussed on Rounds with the Respiratory Therapy Staff  Allyne Gee, MD [facility] Pulmonary Critical Care Medicine Sleep Medicine

## 2018-05-13 DIAGNOSIS — J9621 Acute and chronic respiratory failure with hypoxia: Secondary | ICD-10-CM | POA: Diagnosis not present

## 2018-05-13 DIAGNOSIS — N186 End stage renal disease: Secondary | ICD-10-CM | POA: Diagnosis not present

## 2018-05-13 DIAGNOSIS — J181 Lobar pneumonia, unspecified organism: Secondary | ICD-10-CM | POA: Diagnosis not present

## 2018-05-13 DIAGNOSIS — I5022 Chronic systolic (congestive) heart failure: Secondary | ICD-10-CM | POA: Diagnosis not present

## 2018-05-13 LAB — CBC
HCT: 22.9 % — ABNORMAL LOW (ref 39.0–52.0)
Hemoglobin: 7.1 g/dL — ABNORMAL LOW (ref 13.0–17.0)
MCH: 33.2 pg (ref 26.0–34.0)
MCHC: 31 g/dL (ref 30.0–36.0)
MCV: 107 fL — ABNORMAL HIGH (ref 80.0–100.0)
Platelets: 229 10*3/uL (ref 150–400)
RBC: 2.14 MIL/uL — ABNORMAL LOW (ref 4.22–5.81)
RDW: 17.3 % — ABNORMAL HIGH (ref 11.5–15.5)
WBC: 9.9 10*3/uL (ref 4.0–10.5)
nRBC: 0 % (ref 0.0–0.2)

## 2018-05-13 LAB — CULTURE, RESPIRATORY W GRAM STAIN

## 2018-05-13 NOTE — Progress Notes (Addendum)
Pulmonary Critical Care Medicine [place]   PULMONARY CRITICAL CARE SERVICE  PROGRESS NOTE  Date of Service: 05/13/2018  Jared Hancock  LGX:[id number]  DOB: 1945-06-16   DOA: 05/03/2018  Referring Physician: Merton Border, MD  HPI: Jared Hancock is a 73 y.o. male seen for follow up of Acute on Chronic Respiratory Failure.  Patient remains orally intubated at this time.  Tolerating pressure support for more than 3 days.  Continues to have a tenacious clear secretions.  Medications: Reviewed on Rounds  Physical Exam:  Vitals: Pulse 75 respirations 20 BP 100/35 O2 sat 98% temp 98.6  Ventilator Settings ventilation mode pressure support FiO2 28% pressure of 12 PEEP of 5  . General: Comfortable at this time . Eyes: Grossly normal lids, irises & conjunctiva . ENT: grossly tongue is normal . Neck: no obvious mass . Cardiovascular: S1 S2 normal no gallop . Respiratory: Coarse breath sounds . Abdomen: soft . Skin: no rash seen on limited exam . Musculoskeletal: not rigid . Psychiatric:unable to assess . Neurologic: no seizure no involuntary movements         Lab Data:   Basic Metabolic Panel: Recent Labs  Lab 05/07/18 0845 05/08/18 0521 05/10/18 0522 05/12/18 0622  NA 132* 131* 131* 130*  K 3.7 3.7 4.3 4.4  CL 95* 95* 92* 92*  CO2 20* 20* 23 23  GLUCOSE 104* 98 100* 107*  BUN 60* 73* 57* 62*  CREATININE 7.60* 9.12* 7.77* 7.62*  CALCIUM 9.0 8.8* 9.2 9.2  MG 2.5*  --   --  2.6*  PHOS 4.5 5.2* 5.1* 4.7*    ABG: No results for input(s): PHART, PCO2ART, PO2ART, HCO3, O2SAT in the last 168 hours.  Liver Function Tests: Recent Labs  Lab 05/07/18 0845 05/08/18 0521 05/10/18 0522 05/12/18 0622  ALBUMIN 2.2* 2.3* 2.3* 2.3*   No results for input(s): LIPASE, AMYLASE in the last 168 hours. No results for input(s): AMMONIA in the last 168 hours.  CBC: Recent Labs  Lab 05/07/18 0845 05/08/18 0521 05/10/18 0522 05/12/18 0622 05/13/18 0615  WBC  8.2 8.7 9.6 11.6* 9.9  HGB 8.2* 7.6* 8.0* 7.3* 7.1*  HCT 25.5* 24.0* 24.8* 23.5* 22.9*  MCV 104.9* 104.8* 104.2* 105.9* 107.0*  PLT 320 362 268 273 229    Cardiac Enzymes: No results for input(s): CKTOTAL, CKMB, CKMBINDEX, TROPONINI in the last 168 hours.  BNP (last 3 results) No results for input(s): BNP in the last 8760 hours.  ProBNP (last 3 results) No results for input(s): PROBNP in the last 8760 hours.  Radiological Exams: No results found.  Assessment/Plan Active Problems:   Acute on chronic respiratory failure with hypoxia (HCC)   Seizure disorder (HCC)   End stage renal disease on dialysis ([place])   Lobar pneumonia (HCC)   Chronic systolic heart failure ([place])   1. Acute on chronic respiratory failure with hypoxia patient remains on pressure support mode currently 20% FiO2 with upper support 12/5.  Patient is awaiting trach placement.  To continue weaning process. 2. Seizure disorder no active seizures noted at this time 3. End-stage renal disease on hemodialysis continue supportive care 4. Lobar pneumonia treated continue present therapy 5. Chronic systolic heart failure continue management per nephrology recommendations   I have personally seen and evaluated the patient, evaluated laboratory and imaging results, formulated the assessment and plan and placed orders. The Patient requires high complexity decision making for assessment and support.  Case was discussed on Rounds with the Respiratory Therapy  Staff  Allyne Gee, MD [facility] Pulmonary Critical Care Medicine Sleep Medicine

## 2018-05-14 DIAGNOSIS — J181 Lobar pneumonia, unspecified organism: Secondary | ICD-10-CM | POA: Diagnosis not present

## 2018-05-14 DIAGNOSIS — I5022 Chronic systolic (congestive) heart failure: Secondary | ICD-10-CM | POA: Diagnosis not present

## 2018-05-14 DIAGNOSIS — N186 End stage renal disease: Secondary | ICD-10-CM | POA: Diagnosis not present

## 2018-05-14 DIAGNOSIS — J9621 Acute and chronic respiratory failure with hypoxia: Secondary | ICD-10-CM | POA: Diagnosis not present

## 2018-05-14 LAB — VANCOMYCIN, TROUGH: Vancomycin Tr: 19 ug/mL (ref 15–20)

## 2018-05-14 NOTE — Progress Notes (Addendum)
Pulmonary Critical Care Medicine [place]   PULMONARY CRITICAL CARE SERVICE  PROGRESS NOTE  Date of Service: 05/14/2018  Jared Hancock  JIR:[id number]  DOB: 1945/10/05   DOA: 05/03/2018  Referring Physician: Merton Border, MD  HPI: Jared Hancock is a 73 y.o. male seen for follow up of Acute on Chronic Respiratory Failure.  Patient continues to have a copious amount of tenacious clear secretions.  Patient remains on pressure support at this time.  Good saturations are noted patient is afebrile. Medications: Reviewed on Rounds  Physical Exam:  Vitals: Pulse 76 respirations 18 BP 148/78 O2 sat 96% temp 98.4  Ventilator pressure support 28% 12/5  . General: Comfortable at this time . Eyes: Grossly normal lids, irises & conjunctiva . ENT: grossly tongue is normal . Neck: no obvious mass . Cardiovascular: S1 S2 normal no gallop . Respiratory: Coarse breath sounds . Abdomen: soft . Skin: no rash seen on limited exam . Musculoskeletal: not rigid . Psychiatric:unable to assess . Neurologic: no seizure no involuntary movements         Lab Data:   Basic Metabolic Panel: Recent Labs  Lab 05/08/18 0521 05/10/18 0522 05/12/18 0622  NA 131* 131* 130*  K 3.7 4.3 4.4  CL 95* 92* 92*  CO2 20* 23 23  GLUCOSE 98 100* 107*  BUN 73* 57* 62*  CREATININE 9.12* 7.77* 7.62*  CALCIUM 8.8* 9.2 9.2  MG  --   --  2.6*  PHOS 5.2* 5.1* 4.7*    ABG: No results for input(s): PHART, PCO2ART, PO2ART, HCO3, O2SAT in the last 168 hours.  Liver Function Tests: Recent Labs  Lab 05/08/18 0521 05/10/18 0522 05/12/18 0622  ALBUMIN 2.3* 2.3* 2.3*   No results for input(s): LIPASE, AMYLASE in the last 168 hours. No results for input(s): AMMONIA in the last 168 hours.  CBC: Recent Labs  Lab 05/08/18 0521 05/10/18 0522 05/12/18 0622 05/13/18 0615  WBC 8.7 9.6 11.6* 9.9  HGB 7.6* 8.0* 7.3* 7.1*  HCT 24.0* 24.8* 23.5* 22.9*  MCV 104.8* 104.2* 105.9* 107.0*  PLT 362 268  273 229    Cardiac Enzymes: No results for input(s): CKTOTAL, CKMB, CKMBINDEX, TROPONINI in the last 168 hours.  BNP (last 3 results) No results for input(s): BNP in the last 8760 hours.  ProBNP (last 3 results) No results for input(s): PROBNP in the last 8760 hours.  Radiological Exams: No results found.  Assessment/Plan Active Problems:   Acute on chronic respiratory failure with hypoxia (HCC)   Seizure disorder (HCC)   End stage renal disease on dialysis ([place])   Lobar pneumonia (HCC)   Chronic systolic heart failure ([place])   1. Acute chronic respiratory failure with hypoxia patient remains on pressure support mode currently 20% FiO2 with support 12/5.  Awaiting trach placement at this time continue weaning as tolerated. 2. Seizure disorder no active seizures noted at this time 3. End-stage renal disease on hemodialysis continue supportive care 4. Lobar pneumonia treated continue present therapy 5. Chronic systolic heart failure continue management per nephrology recommendations   I have personally seen and evaluated the patient, evaluated laboratory and imaging results, formulated the assessment and plan and placed orders. The Patient requires high complexity decision making for assessment and support.  Case was discussed on Rounds with the Respiratory Therapy Staff  Allyne Gee, MD [facility] Pulmonary Critical Care Medicine Sleep Medicine

## 2018-05-15 DIAGNOSIS — J181 Lobar pneumonia, unspecified organism: Secondary | ICD-10-CM | POA: Diagnosis not present

## 2018-05-15 DIAGNOSIS — J9621 Acute and chronic respiratory failure with hypoxia: Secondary | ICD-10-CM | POA: Diagnosis not present

## 2018-05-15 DIAGNOSIS — N186 End stage renal disease: Secondary | ICD-10-CM | POA: Diagnosis not present

## 2018-05-15 DIAGNOSIS — I5022 Chronic systolic (congestive) heart failure: Secondary | ICD-10-CM | POA: Diagnosis not present

## 2018-05-15 LAB — COMPREHENSIVE METABOLIC PANEL
ALK PHOS: 78 U/L (ref 38–126)
ALT: 20 U/L (ref 0–44)
AST: 28 U/L (ref 15–41)
Albumin: 2 g/dL — ABNORMAL LOW (ref 3.5–5.0)
Anion gap: 13 (ref 5–15)
BUN: 79 mg/dL — ABNORMAL HIGH (ref 8–23)
CALCIUM: 8.7 mg/dL — AB (ref 8.9–10.3)
CO2: 22 mmol/L (ref 22–32)
Chloride: 95 mmol/L — ABNORMAL LOW (ref 98–111)
Creatinine, Ser: 9.16 mg/dL — ABNORMAL HIGH (ref 0.61–1.24)
GFR calc Af Amer: 6 mL/min — ABNORMAL LOW (ref >60)
GFR calc non Af Amer: 5 mL/min — ABNORMAL LOW (ref >60)
Glucose, Bld: 102 mg/dL — ABNORMAL HIGH (ref 70–99)
Potassium: 4.8 mmol/L (ref 3.5–5.1)
Sodium: 130 mmol/L — ABNORMAL LOW (ref 135–145)
Total Bilirubin: 0.4 mg/dL (ref 0.3–1.2)
Total Protein: 6.8 g/dL (ref 6.5–8.1)

## 2018-05-15 LAB — CBC
HCT: 22.3 % — ABNORMAL LOW (ref 39.0–52.0)
Hemoglobin: 7 g/dL — ABNORMAL LOW (ref 13.0–17.0)
MCH: 32.4 pg (ref 26.0–34.0)
MCHC: 31.4 g/dL (ref 30.0–36.0)
MCV: 103.2 fL — ABNORMAL HIGH (ref 80.0–100.0)
Platelets: 234 10*3/uL (ref 150–400)
RBC: 2.16 MIL/uL — ABNORMAL LOW (ref 4.22–5.81)
RDW: 17.5 % — ABNORMAL HIGH (ref 11.5–15.5)
WBC: 11.3 10*3/uL — ABNORMAL HIGH (ref 4.0–10.5)
nRBC: 0 % (ref 0.0–0.2)

## 2018-05-15 LAB — PHOSPHORUS: Phosphorus: 5 mg/dL — ABNORMAL HIGH (ref 2.5–4.6)

## 2018-05-15 NOTE — Progress Notes (Signed)
[facility]  ROUNDING NOTE   Subjective:  Patient completed dialysis today. Tolerated well. Ultrafiltration achieved was 1.5 kg. Still on the ventilator.  Objective:  Vital signs in last 24 hours:  Temperature 90.3 pulse 70 respirations 16 blood pressure 133/51  Physical Exam: General: Critically ill-appearing  Head: ETT/NG in place  Eyes: Anicteric  Neck: Supple, trachea midline  Lungs:  Scattered rhonchi, vent assisted  Heart: S1S2 no rubs  Abdomen:  Soft, nontender, bowel sounds present  Extremities: trace peripheral edema.  Neurologic: Arousable  Skin: No lesions  Access: R IJ permcath    Basic Metabolic Panel: Recent Labs  Lab 05/10/18 0522 05/12/18 0622 05/15/18 0657  NA 131* 130* 130*  K 4.3 4.4 4.8  CL 92* 92* 95*  CO2 23 23 22   GLUCOSE 100* 107* 102*  BUN 57* 62* 79*  CREATININE 7.77* 7.62* 9.16*  CALCIUM 9.2 9.2 8.7*  MG  --  2.6*  --   PHOS 5.1* 4.7* 5.0*    Liver Function Tests: Recent Labs  Lab 05/10/18 0522 05/12/18 0622 05/15/18 0657  AST  --   --  28  ALT  --   --  20  ALKPHOS  --   --  78  BILITOT  --   --  0.4  PROT  --   --  6.8  ALBUMIN 2.3* 2.3* 2.0*   No results for input(s): LIPASE, AMYLASE in the last 168 hours. No results for input(s): AMMONIA in the last 168 hours.  CBC: Recent Labs  Lab 05/10/18 0522 05/12/18 0622 05/13/18 0615 05/15/18 0900  WBC 9.6 11.6* 9.9 11.3*  HGB 8.0* 7.3* 7.1* 7.0*  HCT 24.8* 23.5* 22.9* 22.3*  MCV 104.2* 105.9* 107.0* 103.2*  PLT 268 273 229 234    Cardiac Enzymes: No results for input(s): CKTOTAL, CKMB, CKMBINDEX, TROPONINI in the last 168 hours.  BNP: Invalid input(s): POCBNP  CBG: No results for input(s): GLUCAP in the last 168 hours.  Microbiology: Results for orders placed or performed during the hospital encounter of 05/03/18  C difficile quick scan w PCR reflex     Status: Abnormal   Collection Time: 05/03/18  7:40 PM  Result Value Ref Range Status   C  Diff antigen POSITIVE (A) NEGATIVE Final   C Diff toxin NEGATIVE NEGATIVE Final   C Diff interpretation Results are indeterminate. See PCR results.  Final    Comment: Performed at [facility], [place] [address], [place], [place] [address]  C. Diff by PCR, Reflexed     Status: Abnormal   Collection Time: 05/03/18  7:40 PM  Result Value Ref Range Status   Toxigenic C. Difficile by PCR POSITIVE (A) NEGATIVE Final    Comment: Positive for toxigenic C. difficile with little to no toxin production. Only treat if clinical presentation suggests symptomatic illness. Performed at [facility], [place] [address], [place], [place] [address]   Expectorated sputum assessment w rflx to resp cult     Status: None   Collection Time: 05/04/18  9:21 AM  Result Value Ref Range Status   Specimen Description EXPECTORATED SPUTUM  Final   Special Requests NONE  Final   Sputum evaluation   Final    THIS SPECIMEN IS ACCEPTABLE FOR SPUTUM CULTURE Performed at [facility], [place] [address], [place], [place] [address]    Report Status 05/04/2018 FINAL  Final  Culture, respiratory     Status: None   Collection Time: 05/04/18  9:21 AM  Result Value Ref Range Status   Specimen Description EXPECTORATED SPUTUM  Final   Special Requests NONE Reflexed from [id number]  Final   Gram Stain   Final    ABUNDANT WBC PRESENT, PREDOMINANTLY PMN RARE SQUAMOUS EPITHELIAL CELLS PRESENT NO ORGANISMS SEEN    Culture   Final    FEW Consistent with normal respiratory flora. Performed at [facility], [place] [address], [place], [place] [address]    Report Status 05/06/2018 FINAL  Final  Culture, blood (routine x 2)     Status: None (Preliminary result)   Collection Time: 05/11/18  1:40 PM  Result Value Ref Range Status   Specimen Description BLOOD RIGHT HAND  Final   Special Requests   Final    BOTTLES DRAWN AEROBIC ONLY Blood Culture results may not be optimal due to an inadequate volume of blood received in  culture bottles   Culture   Final    NO GROWTH 4 DAYS Performed at [facility], [place] [address] [place], [place] [address]    Report Status PENDING  Incomplete  Culture, blood (routine x 2)     Status: None (Preliminary result)   Collection Time: 05/11/18  1:45 PM  Result Value Ref Range Status   Specimen Description BLOOD RIGHT HAND  Final   Special Requests   Final    BOTTLES DRAWN AEROBIC ONLY Blood Culture results may not be optimal due to an inadequate volume of blood received in culture bottles   Culture   Final    NO GROWTH 4 DAYS Performed at [facility], [place] [address], [place], [place] [address]    Report Status PENDING  Incomplete  Culture, respiratory     Status: None   Collection Time: 05/11/18  4:20 PM  Result Value Ref Range Status   Specimen Description SPUTUM  Final   Special Requests NONE  Final   Gram Stain   Final    ABUNDANT WBC PRESENT,BOTH PMN AND MONONUCLEAR NO ORGANISMS SEEN Performed at [facility], [address], [place], [place] [address]    Culture FEW ENTEROCOCCUS FAECALIS  Final   Report Status 05/13/2018 FINAL  Final   Organism ID, Bacteria ENTEROCOCCUS FAECALIS  Final      Susceptibility   Enterococcus faecalis - MIC*    AMPICILLIN <=2 SENSITIVE Sensitive     VANCOMYCIN 2 SENSITIVE Sensitive     GENTAMICIN SYNERGY SENSITIVE Sensitive     * FEW ENTEROCOCCUS FAECALIS    Coagulation Studies: No results for input(s): LABPROT, INR in the last 72 hours.  Urinalysis: No results for input(s): COLORURINE, LABSPEC, PHURINE, GLUCOSEU, HGBUR, BILIRUBINUR, KETONESUR, PROTEINUR, UROBILINOGEN, NITRITE, LEUKOCYTESUR in the last 72 hours.  Invalid input(s): APPERANCEUR    Imaging: No results found.   Medications:       Assessment/ Plan:  73 y.o. male with a PMHx of COPD, end-stage renal disease, hypertension, hepatitis C, cirrhosis, chronic systolic heart failure, depression, anemia of chronic kidney disease, secondary  hyperparathyroidism, seizure disorder, who was admitted to [facility] on 05/03/2018 for ongoing treatment of respiratory failure and end-stage renal disease.   1.  ESRD on HD.    Patient completed hemodialysis today.  Ultrafiltration achieved was 1.5 kg.  Next [place] on Wednesday.  2.  Acute respiratory failure.    Continue current ventilatory support.  3.  Anemia of chronic kidney disease.    Hemoglobin did drop to 7.0.  This is to be rechecked  in a.m.  If this remains 7 or below blood transfusion to be planned for tomorrow.  4.  Secondary hyperparathyroidism.    Phosphorus 5.0 and currently within acceptable range.  Follow-up on Wednesday.   LOS: 0 Srija Southard 3/16/20203:12 PM

## 2018-05-15 NOTE — Progress Notes (Signed)
Pulmonary Critical Care Medicine [place]   PULMONARY CRITICAL CARE SERVICE  PROGRESS NOTE  Date of Service: 05/15/2018  Jared Hancock  WUX:[id number]  DOB: 1945/09/21   DOA: 05/03/2018  Referring Physician: Merton Border, MD  HPI: Jared Hancock is a 73 y.o. male seen for follow up of Acute on Chronic Respiratory Failure.  Patient right now is on pressure support mode on 28% FiO2 patient was attempted at weaning on T collar and failed  Medications: Reviewed on Rounds  Physical Exam:  Vitals: Temperature 98.5 pulse 70 respiratory 16 blood pressure 133/57 saturations 98%  Ventilator Settings mode ventilation pressure support FiO2 28% tidal line 580 pressure poor 12 PEEP 5  . General: Comfortable at this time . Eyes: Grossly normal lids, irises & conjunctiva . ENT: grossly tongue is normal . Neck: no obvious mass . Cardiovascular: S1 S2 normal no gallop . Respiratory: No rhonchi no rales are noted at this time . Abdomen: soft . Skin: no rash seen on limited exam . Musculoskeletal: not rigid . Psychiatric:unable to assess . Neurologic: no seizure no involuntary movements         Lab Data:   Basic Metabolic Panel: Recent Labs  Lab 05/10/18 0522 05/12/18 0622 05/15/18 0657  NA 131* 130* 130*  K 4.3 4.4 4.8  CL 92* 92* 95*  CO2 23 23 22   GLUCOSE 100* 107* 102*  BUN 57* 62* 79*  CREATININE 7.77* 7.62* 9.16*  CALCIUM 9.2 9.2 8.7*  MG  --  2.6*  --   PHOS 5.1* 4.7* 5.0*    ABG: No results for input(s): PHART, PCO2ART, PO2ART, HCO3, O2SAT in the last 168 hours.  Liver Function Tests: Recent Labs  Lab 05/10/18 0522 05/12/18 0622 05/15/18 0657  AST  --   --  28  ALT  --   --  20  ALKPHOS  --   --  78  BILITOT  --   --  0.4  PROT  --   --  6.8  ALBUMIN 2.3* 2.3* 2.0*   No results for input(s): LIPASE, AMYLASE in the last 168 hours. No results for input(s): AMMONIA in the last 168 hours.  CBC: Recent Labs  Lab 05/10/18 0522 05/12/18 0622  05/13/18 0615 05/15/18 0900  WBC 9.6 11.6* 9.9 11.3*  HGB 8.0* 7.3* 7.1* 7.0*  HCT 24.8* 23.5* 22.9* 22.3*  MCV 104.2* 105.9* 107.0* 103.2*  PLT 268 273 229 234    Cardiac Enzymes: No results for input(s): CKTOTAL, CKMB, CKMBINDEX, TROPONINI in the last 168 hours.  BNP (last 3 results) No results for input(s): BNP in the last 8760 hours.  ProBNP (last 3 results) No results for input(s): PROBNP in the last 8760 hours.  Radiological Exams: No results found.  Assessment/Plan Active Problems:   Acute on chronic respiratory failure with hypoxia (HCC)   Seizure disorder (HCC)   End stage renal disease on dialysis ([place])   Lobar pneumonia (HCC)   Chronic systolic heart failure ([place])   1. Acute on chronic respiratory failure with hypoxia we will continue with pressure support right now reassess T collar trials again 2. Seizure disorder no active seizures 3. End-stage renal disease on hemodialysis followed by nephrology 4. Lobar pneumonia treated clinically stable 5. Chronic systolic heart failure at baseline continue with supportive care   I have personally seen and evaluated the patient, evaluated laboratory and imaging results, formulated the assessment and plan and placed orders. The Patient requires high complexity decision making for  assessment and support.  Case was discussed on Rounds with the Respiratory Therapy Staff  Allyne Gee, MD [facility] Pulmonary Critical Care Medicine Sleep Medicine

## 2018-05-16 DIAGNOSIS — J9621 Acute and chronic respiratory failure with hypoxia: Secondary | ICD-10-CM | POA: Diagnosis not present

## 2018-05-16 DIAGNOSIS — J181 Lobar pneumonia, unspecified organism: Secondary | ICD-10-CM | POA: Diagnosis not present

## 2018-05-16 DIAGNOSIS — I5022 Chronic systolic (congestive) heart failure: Secondary | ICD-10-CM | POA: Diagnosis not present

## 2018-05-16 DIAGNOSIS — N186 End stage renal disease: Secondary | ICD-10-CM | POA: Diagnosis not present

## 2018-05-16 LAB — CULTURE, BLOOD (ROUTINE X 2)
Culture: NO GROWTH
Culture: NO GROWTH

## 2018-05-16 LAB — CBC
HCT: 21.8 % — ABNORMAL LOW (ref 39.0–52.0)
Hemoglobin: 6.8 g/dL — CL (ref 13.0–17.0)
MCH: 32.7 pg (ref 26.0–34.0)
MCHC: 31.2 g/dL (ref 30.0–36.0)
MCV: 104.8 fL — AB (ref 80.0–100.0)
Platelets: 219 10*3/uL (ref 150–400)
RBC: 2.08 MIL/uL — ABNORMAL LOW (ref 4.22–5.81)
RDW: 17.4 % — ABNORMAL HIGH (ref 11.5–15.5)
WBC: 10 10*3/uL (ref 4.0–10.5)
nRBC: 0 % (ref 0.0–0.2)

## 2018-05-16 LAB — ABO/RH: ABO/RH(D): B POS

## 2018-05-16 LAB — PREPARE RBC (CROSSMATCH)

## 2018-05-16 NOTE — Progress Notes (Signed)
Pulmonary Critical Care Medicine [place]   PULMONARY CRITICAL CARE SERVICE  PROGRESS NOTE  Date of Service: 05/16/2018  Jared Hancock  XTK:[id number]  DOB: October 31, 1945   DOA: 05/03/2018  Referring Physician: Merton Border, MD  HPI: Jared Hancock is a 73 y.o. male seen for follow up of Acute on Chronic Respiratory Failure.  Patient is on pressure support mode endotracheally intubated awaiting tracheostomy patient has been tolerating pressure support 28% FiO2  Medications: Reviewed on Rounds  Physical Exam:  Vitals: Temperature 98.4 pulse 72 respiratory rate 22 blood pressure 129/53 saturations 98%  Ventilator Settings mode ventilation pressure support FiO2 28% tidal volume 450 pressure support 12 PEEP 5  . General: Comfortable at this time . Eyes: Grossly normal lids, irises & conjunctiva . ENT: grossly tongue is normal . Neck: no obvious mass . Cardiovascular: S1 S2 normal no gallop . Respiratory: Scattered rhonchi expansion is equal . Abdomen: soft . Skin: no rash seen on limited exam . Musculoskeletal: not rigid . Psychiatric:unable to assess . Neurologic: no seizure no involuntary movements         Lab Data:   Basic Metabolic Panel: Recent Labs  Lab 05/10/18 0522 05/12/18 0622 05/15/18 0657  NA 131* 130* 130*  K 4.3 4.4 4.8  CL 92* 92* 95*  CO2 23 23 22   GLUCOSE 100* 107* 102*  BUN 57* 62* 79*  CREATININE 7.77* 7.62* 9.16*  CALCIUM 9.2 9.2 8.7*  MG  --  2.6*  --   PHOS 5.1* 4.7* 5.0*    ABG: No results for input(s): PHART, PCO2ART, PO2ART, HCO3, O2SAT in the last 168 hours.  Liver Function Tests: Recent Labs  Lab 05/10/18 0522 05/12/18 0622 05/15/18 0657  AST  --   --  28  ALT  --   --  20  ALKPHOS  --   --  78  BILITOT  --   --  0.4  PROT  --   --  6.8  ALBUMIN 2.3* 2.3* 2.0*   No results for input(s): LIPASE, AMYLASE in the last 168 hours. No results for input(s): AMMONIA in the last 168 hours.  CBC: Recent Labs  Lab  05/10/18 0522 05/12/18 0622 05/13/18 0615 05/15/18 0900 05/16/18 0504  WBC 9.6 11.6* 9.9 11.3* 10.0  HGB 8.0* 7.3* 7.1* 7.0* 6.8*  HCT 24.8* 23.5* 22.9* 22.3* 21.8*  MCV 104.2* 105.9* 107.0* 103.2* 104.8*  PLT 268 273 229 234 219    Cardiac Enzymes: No results for input(s): CKTOTAL, CKMB, CKMBINDEX, TROPONINI in the last 168 hours.  BNP (last 3 results) No results for input(s): BNP in the last 8760 hours.  ProBNP (last 3 results) No results for input(s): PROBNP in the last 8760 hours.  Radiological Exams: No results found.  Assessment/Plan Active Problems:   Acute on chronic respiratory failure with hypoxia (HCC)   Seizure disorder (HCC)   End stage renal disease on dialysis ([place])   Lobar pneumonia (HCC)   Chronic systolic heart failure ([place])   1. Acute on chronic respiratory failure hypoxia we will continue with full support on pressure support mode right now patient is going to need a tracheostomy for anticipated prolonged 2. Seizure disorder no active seizures noted 3. End-stage renal disease on dialysis followed by nephrology 4. Lobar pneumonia treated we will continue with supportive care 5. Chronic systolic heart failure fluid management per nephrology   I have personally seen and evaluated the patient, evaluated laboratory and imaging results, formulated the assessment and  plan and placed orders. The Patient requires high complexity decision making for assessment and support.  Case was discussed on Rounds with the Respiratory Therapy Staff  Allyne Gee, MD [facility] Pulmonary Critical Care Medicine Sleep Medicine

## 2018-05-17 DIAGNOSIS — N186 End stage renal disease: Secondary | ICD-10-CM | POA: Diagnosis not present

## 2018-05-17 DIAGNOSIS — J181 Lobar pneumonia, unspecified organism: Secondary | ICD-10-CM | POA: Diagnosis not present

## 2018-05-17 DIAGNOSIS — I5022 Chronic systolic (congestive) heart failure: Secondary | ICD-10-CM | POA: Diagnosis not present

## 2018-05-17 DIAGNOSIS — J9621 Acute and chronic respiratory failure with hypoxia: Secondary | ICD-10-CM | POA: Diagnosis not present

## 2018-05-17 LAB — CBC
HEMATOCRIT: 26.3 % — AB (ref 39.0–52.0)
Hemoglobin: 8.5 g/dL — ABNORMAL LOW (ref 13.0–17.0)
MCH: 32.4 pg (ref 26.0–34.0)
MCHC: 32.3 g/dL (ref 30.0–36.0)
MCV: 100.4 fL — ABNORMAL HIGH (ref 80.0–100.0)
Platelets: 220 10*3/uL (ref 150–400)
RBC: 2.62 MIL/uL — ABNORMAL LOW (ref 4.22–5.81)
RDW: 18.4 % — AB (ref 11.5–15.5)
WBC: 11.8 10*3/uL — ABNORMAL HIGH (ref 4.0–10.5)
nRBC: 0 % (ref 0.0–0.2)

## 2018-05-17 LAB — RENAL FUNCTION PANEL
Albumin: 2.1 g/dL — ABNORMAL LOW (ref 3.5–5.0)
Anion gap: 10 (ref 5–15)
BUN: 64 mg/dL — ABNORMAL HIGH (ref 8–23)
CO2: 25 mmol/L (ref 22–32)
Calcium: 9 mg/dL (ref 8.9–10.3)
Chloride: 95 mmol/L — ABNORMAL LOW (ref 98–111)
Creatinine, Ser: 8.12 mg/dL — ABNORMAL HIGH (ref 0.61–1.24)
GFR calc Af Amer: 7 mL/min — ABNORMAL LOW (ref >60)
GFR calc non Af Amer: 6 mL/min — ABNORMAL LOW (ref >60)
GLUCOSE: 107 mg/dL — AB (ref 70–99)
Phosphorus: 4.9 mg/dL — ABNORMAL HIGH (ref 2.5–4.6)
Potassium: 4.7 mmol/L (ref 3.5–5.1)
Sodium: 130 mmol/L — ABNORMAL LOW (ref 135–145)

## 2018-05-17 LAB — BPAM RBC
Blood Product Expiration Date: 202004112359
ISSUE DATE / TIME: 202003171053
Unit Type and Rh: 7300

## 2018-05-17 LAB — TYPE AND SCREEN
ABO/RH(D): B POS
Antibody Screen: NEGATIVE
Unit division: 0

## 2018-05-17 LAB — VANCOMYCIN, TROUGH: Vancomycin Tr: 28 ug/mL (ref 15–20)

## 2018-05-17 NOTE — Progress Notes (Addendum)
Pulmonary Critical Care Medicine [place]   PULMONARY CRITICAL CARE SERVICE  PROGRESS NOTE  Date of Service: 05/17/2018  Jared Hancock  XTK:[id number]  DOB: August 08, 1945   DOA: 05/03/2018  Referring Physician: Merton Border, MD  HPI: Jared Hancock is a 73 y.o. male seen for follow up of Acute on Chronic Respiratory Failure.  Patient has been on pressure support 12/5 FiO2 28% for over 7 days.  Respiratory therapy attempted to deflate the cuff on his ET tube with no success.  Patient has ENT consult pending for tracheostomy placement.  Medications: Reviewed on Rounds  Physical Exam:  Vitals: Pulse 78 respirations 13 BP 181/62 O2 sat 100% temp 97.4  Ventilator Settings ventilator mode pressure support 12/5 FiO2 28%  . General: Comfortable at this time . Eyes: Grossly normal lids, irises & conjunctiva . ENT: grossly tongue is normal . Neck: no obvious mass . Cardiovascular: S1 S2 normal no gallop . Respiratory: Scattered rhonchi with equal expansion noted . Abdomen: soft . Skin: no rash seen on limited exam . Musculoskeletal: not rigid . Psychiatric:unable to assess . Neurologic: no seizure no involuntary movements         Lab Data:   Basic Metabolic Panel: Recent Labs  Lab 05/12/18 0622 05/15/18 0657 05/17/18 0534  NA 130* 130* 130*  K 4.4 4.8 4.7  CL 92* 95* 95*  CO2 23 22 25   GLUCOSE 107* 102* 107*  BUN 62* 79* 64*  CREATININE 7.62* 9.16* 8.12*  CALCIUM 9.2 8.7* 9.0  MG 2.6*  --   --   PHOS 4.7* 5.0* 4.9*    ABG: No results for input(s): PHART, PCO2ART, PO2ART, HCO3, O2SAT in the last 168 hours.  Liver Function Tests: Recent Labs  Lab 05/12/18 0622 05/15/18 0657 05/17/18 0534  AST  --  28  --   ALT  --  20  --   ALKPHOS  --  78  --   BILITOT  --  0.4  --   PROT  --  6.8  --   ALBUMIN 2.3* 2.0* 2.1*   No results for input(s): LIPASE, AMYLASE in the last 168 hours. No results for input(s): AMMONIA in the last 168  hours.  CBC: Recent Labs  Lab 05/12/18 0622 05/13/18 0615 05/15/18 0900 05/16/18 0504 05/17/18 0534  WBC 11.6* 9.9 11.3* 10.0 11.8*  HGB 7.3* 7.1* 7.0* 6.8* 8.5*  HCT 23.5* 22.9* 22.3* 21.8* 26.3*  MCV 105.9* 107.0* 103.2* 104.8* 100.4*  PLT 273 229 234 219 220    Cardiac Enzymes: No results for input(s): CKTOTAL, CKMB, CKMBINDEX, TROPONINI in the last 168 hours.  BNP (last 3 results) No results for input(s): BNP in the last 8760 hours.  ProBNP (last 3 results) No results for input(s): PROBNP in the last 8760 hours.  Radiological Exams: No results found.  Assessment/Plan Active Problems:   Acute on chronic respiratory failure with hypoxia (HCC)   Seizure disorder (HCC)   End stage renal disease on dialysis ([place])   Lobar pneumonia (HCC)   Chronic systolic heart failure ([place])   1. Acute on chronic respiratory failure with hypoxia continue with full support on pressure support mode at this time as patient will need tracheostomy for anticipated prolonged weaning. 2. Seizure disorder no active seizures noted 3. End-stage renal disease on dialysis followed by nephrology 4. Lobar pneumonia treated with continue supportive care 5. Chronic systolic heart failure fluid management by nephrology   I have personally seen and evaluated the patient,  evaluated laboratory and imaging results, formulated the assessment and plan and placed orders. The Patient requires high complexity decision making for assessment and support.  Case was discussed on Rounds with the Respiratory Therapy Staff  Allyne Gee, MD [facility] Pulmonary Critical Care Medicine Sleep Medicine

## 2018-05-17 NOTE — Progress Notes (Signed)
[facility]  ROUNDING NOTE   Subjective:  Patient seen at bedside. He completed hemodialysis. Also underwent transfusion yesterday.  Objective:  Vital signs in last 24 hours:  Temperature 97.4 pulse 78 respiration 13 blood pressure 181/62  Physical Exam: General: Critically ill-appearing  Head: ETT/NG in place  Eyes: Anicteric  Neck: Supple, trachea midline  Lungs:  Scattered rhonchi, vent assisted  Heart: S1S2 no rubs  Abdomen:  Soft, nontender, bowel sounds present  Extremities: trace peripheral edema.  Neurologic: Arousable, not following commands  Skin: No lesions  Access: R IJ permcath    Basic Metabolic Panel: Recent Labs  Lab 05/12/18 0622 05/15/18 0657 05/17/18 0534  NA 130* 130* 130*  K 4.4 4.8 4.7  CL 92* 95* 95*  CO2 23 22 25   GLUCOSE 107* 102* 107*  BUN 62* 79* 64*  CREATININE 7.62* 9.16* 8.12*  CALCIUM 9.2 8.7* 9.0  MG 2.6*  --   --   PHOS 4.7* 5.0* 4.9*    Liver Function Tests: Recent Labs  Lab 05/12/18 0622 05/15/18 0657 05/17/18 0534  AST  --  28  --   ALT  --  20  --   ALKPHOS  --  78  --   BILITOT  --  0.4  --   PROT  --  6.8  --   ALBUMIN 2.3* 2.0* 2.1*   No results for input(s): LIPASE, AMYLASE in the last 168 hours. No results for input(s): AMMONIA in the last 168 hours.  CBC: Recent Labs  Lab 05/12/18 0622 05/13/18 0615 05/15/18 0900 05/16/18 0504 05/17/18 0534  WBC 11.6* 9.9 11.3* 10.0 11.8*  HGB 7.3* 7.1* 7.0* 6.8* 8.5*  HCT 23.5* 22.9* 22.3* 21.8* 26.3*  MCV 105.9* 107.0* 103.2* 104.8* 100.4*  PLT 273 229 234 219 220    Cardiac Enzymes: No results for input(s): CKTOTAL, CKMB, CKMBINDEX, TROPONINI in the last 168 hours.  BNP: Invalid input(s): POCBNP  CBG: No results for input(s): GLUCAP in the last 168 hours.  Microbiology: Results for orders placed or performed during the hospital encounter of 05/03/18  C difficile quick scan w PCR reflex     Status: Abnormal   Collection Time: 05/03/18   7:40 PM  Result Value Ref Range Status   C Diff antigen POSITIVE (A) NEGATIVE Final   C Diff toxin NEGATIVE NEGATIVE Final   C Diff interpretation Results are indeterminate. See PCR results.  Final    Comment: Performed at [facility], [place] [address], [place], [place] [address]  C. Diff by PCR, Reflexed     Status: Abnormal   Collection Time: 05/03/18  7:40 PM  Result Value Ref Range Status   Toxigenic C. Difficile by PCR POSITIVE (A) NEGATIVE Final    Comment: Positive for toxigenic C. difficile with little to no toxin production. Only treat if clinical presentation suggests symptomatic illness. Performed at [facility], [place] [address] [place], [place] [address]   Expectorated sputum assessment w rflx to resp cult     Status: None   Collection Time: 05/04/18  9:21 AM  Result Value Ref Range Status   Specimen Description EXPECTORATED SPUTUM  Final   Special Requests NONE  Final   Sputum evaluation   Final    THIS SPECIMEN IS ACCEPTABLE FOR SPUTUM CULTURE Performed at [facility], [place] [address] [place],  [address]    Report Status 05/04/2018 FINAL  Final  Culture, respiratory     Status: None  Collection Time: 05/04/18  9:21 AM  Result Value Ref Range Status   Specimen Description EXPECTORATED SPUTUM  Final   Special Requests NONE Reflexed from [id number]  Final   Gram Stain   Final    ABUNDANT WBC PRESENT, PREDOMINANTLY PMN RARE SQUAMOUS EPITHELIAL CELLS PRESENT NO ORGANISMS SEEN    Culture   Final    FEW Consistent with normal respiratory flora. Performed at [facility], [place] [address] [place], [place] [address]    Report Status 05/06/2018 FINAL  Final  Culture, blood (routine x 2)     Status: None   Collection Time: 05/11/18  1:40 PM  Result Value Ref Range Status   Specimen Description BLOOD RIGHT HAND  Final   Special Requests   Final    BOTTLES DRAWN AEROBIC ONLY Blood Culture results may not be optimal due to an inadequate volume of  blood received in culture bottles   Culture   Final    NO GROWTH 5 DAYS Performed at [facility], [place] [address], [place], [place] [address]    Report Status 05/16/2018 FINAL  Final  Culture, blood (routine x 2)     Status: None   Collection Time: 05/11/18  1:45 PM  Result Value Ref Range Status   Specimen Description BLOOD RIGHT HAND  Final   Special Requests   Final    BOTTLES DRAWN AEROBIC ONLY Blood Culture results may not be optimal due to an inadequate volume of blood received in culture bottles   Culture   Final    NO GROWTH 5 DAYS Performed at [facility], [place] [address] [place], [place] [address]    Report Status 05/16/2018 FINAL  Final  Culture, respiratory     Status: None   Collection Time: 05/11/18  4:20 PM  Result Value Ref Range Status   Specimen Description SPUTUM  Final   Special Requests NONE  Final   Gram Stain   Final    ABUNDANT WBC PRESENT,BOTH PMN AND MONONUCLEAR NO ORGANISMS SEEN Performed at [facility], [address] [place], [place] [address]    Culture FEW ENTEROCOCCUS FAECALIS  Final   Report Status 05/13/2018 FINAL  Final   Organism ID, Bacteria ENTEROCOCCUS FAECALIS  Final      Susceptibility   Enterococcus faecalis - MIC*    AMPICILLIN <=2 SENSITIVE Sensitive     VANCOMYCIN 2 SENSITIVE Sensitive     GENTAMICIN SYNERGY SENSITIVE Sensitive     * FEW ENTEROCOCCUS FAECALIS    Coagulation Studies: No results for input(s): LABPROT, INR in the last 72 hours.  Urinalysis: No results for input(s): COLORURINE, LABSPEC, PHURINE, GLUCOSEU, HGBUR, BILIRUBINUR, KETONESUR, PROTEINUR, UROBILINOGEN, NITRITE, LEUKOCYTESUR in the last 72 hours.  Invalid input(s): APPERANCEUR    Imaging: No results found.   Medications:       Assessment/ Plan:  73 y.o. male with a PMHx of COPD, end-stage renal disease, hypertension, hepatitis C, cirrhosis, chronic systolic heart failure, depression, anemia of chronic kidney disease,  secondary hyperparathyroidism, seizure disorder, who was admitted to [facility] on 05/03/2018 for ongoing treatment of respiratory failure and end-stage renal disease.   1.  ESRD on HD.    Patient completed hemodialysis today.  We will plan for dialysis again on Friday.  Appears to be tolerating dialysis treatments well.  2.  Acute respiratory failure.    Patient remains on the ventilator at this point in time.  He has been difficult to wean.  Tracheostomy may need to be considered.  3.  Anemia of chronic kidney disease.    Hemoglobin up to 8.5 post blood transfusion.  Continue to monitor.  4.  Secondary hyperparathyroidism.    Phosphorus was 4.9 today and at target.  Continue to monitor.   LOS: 0 Jared Hancock 3/18/202012:46 PM

## 2018-05-18 DIAGNOSIS — J181 Lobar pneumonia, unspecified organism: Secondary | ICD-10-CM | POA: Diagnosis not present

## 2018-05-18 DIAGNOSIS — N186 End stage renal disease: Secondary | ICD-10-CM | POA: Diagnosis not present

## 2018-05-18 DIAGNOSIS — J9621 Acute and chronic respiratory failure with hypoxia: Secondary | ICD-10-CM | POA: Diagnosis not present

## 2018-05-18 DIAGNOSIS — I5022 Chronic systolic (congestive) heart failure: Secondary | ICD-10-CM | POA: Diagnosis not present

## 2018-05-18 LAB — BASIC METABOLIC PANEL
ANION GAP: 15 (ref 5–15)
BUN: 44 mg/dL — ABNORMAL HIGH (ref 8–23)
CO2: 22 mmol/L (ref 22–32)
Calcium: 9.1 mg/dL (ref 8.9–10.3)
Chloride: 94 mmol/L — ABNORMAL LOW (ref 98–111)
Creatinine, Ser: 6.13 mg/dL — ABNORMAL HIGH (ref 0.61–1.24)
GFR calc Af Amer: 10 mL/min — ABNORMAL LOW (ref >60)
GFR, EST NON AFRICAN AMERICAN: 8 mL/min — AB (ref >60)
Glucose, Bld: 96 mg/dL (ref 70–99)
POTASSIUM: 4.4 mmol/L (ref 3.5–5.1)
Sodium: 131 mmol/L — ABNORMAL LOW (ref 135–145)

## 2018-05-18 LAB — CBC
HCT: 24.9 % — ABNORMAL LOW (ref 39.0–52.0)
Hemoglobin: 7.8 g/dL — ABNORMAL LOW (ref 13.0–17.0)
MCH: 32 pg (ref 26.0–34.0)
MCHC: 31.3 g/dL (ref 30.0–36.0)
MCV: 102 fL — ABNORMAL HIGH (ref 80.0–100.0)
PLATELETS: 210 10*3/uL (ref 150–400)
RBC: 2.44 MIL/uL — ABNORMAL LOW (ref 4.22–5.81)
RDW: 18 % — AB (ref 11.5–15.5)
WBC: 11.3 10*3/uL — ABNORMAL HIGH (ref 4.0–10.5)
nRBC: 0 % (ref 0.0–0.2)

## 2018-05-18 NOTE — Progress Notes (Signed)
Pulmonary Critical Care Medicine [place]   PULMONARY CRITICAL CARE SERVICE  PROGRESS NOTE  Date of Service: 05/18/2018  KARLON SCHLAFER  CWC:[id number]  DOB: 1945/07/21   DOA: 05/03/2018  Referring Physician: Merton Border, MD  HPI: Jared Hancock is a 73 y.o. male seen for follow up of Acute on Chronic Respiratory Failure.  Remains orally intubated awaiting tracheostomy currently is on pressure support mode has been on 28% FiO2  Medications: Reviewed on Rounds  Physical Exam:  Vitals: Temperature 98.6 pulse 83 respiratory 18 blood pressure 163/74 saturations 99%  Ventilator Settings mode ventilation pressure support FiO2 28% pressure support 12 PEEP 5  . General: Comfortable at this time . Eyes: Grossly normal lids, irises & conjunctiva . ENT: grossly tongue is normal . Neck: no obvious mass . Cardiovascular: S1 S2 normal no gallop . Respiratory: No rhonchi or rales are noted at this time . Abdomen: soft . Skin: no rash seen on limited exam . Musculoskeletal: not rigid . Psychiatric:unable to assess . Neurologic: no seizure no involuntary movements         Lab Data:   Basic Metabolic Panel: Recent Labs  Lab 05/12/18 0622 05/15/18 0657 05/17/18 0534 05/18/18 0529  NA 130* 130* 130* 131*  K 4.4 4.8 4.7 4.4  CL 92* 95* 95* 94*  CO2 23 22 25 22   GLUCOSE 107* 102* 107* 96  BUN 62* 79* 64* 44*  CREATININE 7.62* 9.16* 8.12* 6.13*  CALCIUM 9.2 8.7* 9.0 9.1  MG 2.6*  --   --   --   PHOS 4.7* 5.0* 4.9*  --     ABG: No results for input(s): PHART, PCO2ART, PO2ART, HCO3, O2SAT in the last 168 hours.  Liver Function Tests: Recent Labs  Lab 05/12/18 0622 05/15/18 0657 05/17/18 0534  AST  --  28  --   ALT  --  20  --   ALKPHOS  --  78  --   BILITOT  --  0.4  --   PROT  --  6.8  --   ALBUMIN 2.3* 2.0* 2.1*   No results for input(s): LIPASE, AMYLASE in the last 168 hours. No results for input(s): AMMONIA in the last 168 hours.  CBC: Recent  Labs  Lab 05/13/18 0615 05/15/18 0900 05/16/18 0504 05/17/18 0534 05/18/18 0529  WBC 9.9 11.3* 10.0 11.8* 11.3*  HGB 7.1* 7.0* 6.8* 8.5* 7.8*  HCT 22.9* 22.3* 21.8* 26.3* 24.9*  MCV 107.0* 103.2* 104.8* 100.4* 102.0*  PLT 229 234 219 220 210    Cardiac Enzymes: No results for input(s): CKTOTAL, CKMB, CKMBINDEX, TROPONINI in the last 168 hours.  BNP (last 3 results) No results for input(s): BNP in the last 8760 hours.  ProBNP (last 3 results) No results for input(s): PROBNP in the last 8760 hours.  Radiological Exams: No results found.  Assessment/Plan Active Problems:   Acute on chronic respiratory failure with hypoxia (HCC)   Seizure disorder (HCC)   End stage renal disease on dialysis ([place])   Lobar pneumonia (HCC)   Chronic systolic heart failure ([place])   1. Acute on chronic respiratory failure with hypoxia we will continue with pressure support mode awaiting tracheostomy I think once a tracheostomy is done we should be able to proceed to weaning patient has failed numerous extubation trials 2. Seizure disorder no active seizures noted 3. End-stage renal disease on hemodialysis 4. Lobar pneumonia treated 5. Chronic systolic heart failure at baseline we will continue with supportive care  I have personally seen and evaluated the patient, evaluated laboratory and imaging results, formulated the assessment and plan and placed orders. The Patient requires high complexity decision making for assessment and support.  Case was discussed on Rounds with the Respiratory Therapy Staff  Allyne Gee, MD [facility] Pulmonary Critical Care Medicine Sleep Medicine

## 2018-05-19 DIAGNOSIS — J9621 Acute and chronic respiratory failure with hypoxia: Secondary | ICD-10-CM | POA: Diagnosis not present

## 2018-05-19 DIAGNOSIS — N186 End stage renal disease: Secondary | ICD-10-CM | POA: Diagnosis not present

## 2018-05-19 DIAGNOSIS — I5022 Chronic systolic (congestive) heart failure: Secondary | ICD-10-CM | POA: Diagnosis not present

## 2018-05-19 DIAGNOSIS — J181 Lobar pneumonia, unspecified organism: Secondary | ICD-10-CM | POA: Diagnosis not present

## 2018-05-19 LAB — RENAL FUNCTION PANEL
Albumin: 2 g/dL — ABNORMAL LOW (ref 3.5–5.0)
Anion gap: 15 (ref 5–15)
BUN: 65 mg/dL — AB (ref 8–23)
CO2: 24 mmol/L (ref 22–32)
Calcium: 9.1 mg/dL (ref 8.9–10.3)
Chloride: 92 mmol/L — ABNORMAL LOW (ref 98–111)
Creatinine, Ser: 8.02 mg/dL — ABNORMAL HIGH (ref 0.61–1.24)
GFR calc non Af Amer: 6 mL/min — ABNORMAL LOW (ref >60)
GFR, EST AFRICAN AMERICAN: 7 mL/min — AB (ref >60)
Glucose, Bld: 105 mg/dL — ABNORMAL HIGH (ref 70–99)
Phosphorus: 4.8 mg/dL — ABNORMAL HIGH (ref 2.5–4.6)
Potassium: 4.3 mmol/L (ref 3.5–5.1)
SODIUM: 131 mmol/L — AB (ref 135–145)

## 2018-05-19 LAB — CBC
HCT: 25.5 % — ABNORMAL LOW (ref 39.0–52.0)
HEMOGLOBIN: 8 g/dL — AB (ref 13.0–17.0)
MCH: 32.1 pg (ref 26.0–34.0)
MCHC: 31.4 g/dL (ref 30.0–36.0)
MCV: 102.4 fL — ABNORMAL HIGH (ref 80.0–100.0)
NRBC: 0 % (ref 0.0–0.2)
Platelets: 243 10*3/uL (ref 150–400)
RBC: 2.49 MIL/uL — ABNORMAL LOW (ref 4.22–5.81)
RDW: 17.6 % — ABNORMAL HIGH (ref 11.5–15.5)
WBC: 11.9 10*3/uL — ABNORMAL HIGH (ref 4.0–10.5)

## 2018-05-19 NOTE — Progress Notes (Signed)
Pulmonary Critical Care Medicine [place]   PULMONARY CRITICAL CARE SERVICE  PROGRESS NOTE  Date of Service: 05/19/2018  HAZIM TREADWAY  IRJ:[id number]  DOB: [birth year]   DOA: 05/03/2018  Referring Physician: Merton Border, MD  HPI: Jared Hancock is a 73 y.o. male seen for follow up of Acute on Chronic Respiratory Failure.  Patient currently is on pressure support mode has been on 12/5 good saturations are noted  Medications: Reviewed on Rounds  Physical Exam:  Vitals: Temperature 99.0 pulse 66 respiratory 24 blood pressure 152/59 saturations 96%  Ventilator Settings mode ventilation pressure support FiO2 28% per support 12 PEEP 5 tidal volume 600  . General: Comfortable at this time . Eyes: Grossly normal lids, irises & conjunctiva . ENT: grossly tongue is normal . Neck: no obvious mass . Cardiovascular: S1 S2 normal no gallop . Respiratory: Scattered distant rhonchi are noted bilaterally . Abdomen: soft . Skin: no rash seen on limited exam . Musculoskeletal: not rigid . Psychiatric:unable to assess . Neurologic: no seizure no involuntary movements         Lab Data:   Basic Metabolic Panel: Recent Labs  Lab 05/15/18 0657 05/17/18 0534 05/18/18 0529 05/19/18 0729  NA 130* 130* 131* 131*  K 4.8 4.7 4.4 4.3  CL 95* 95* 94* 92*  CO2 22 25 22 24   GLUCOSE 102* 107* 96 105*  BUN 79* 64* 44* 65*  CREATININE 9.16* 8.12* 6.13* 8.02*  CALCIUM 8.7* 9.0 9.1 9.1  PHOS 5.0* 4.9*  --  4.8*    ABG: No results for input(s): PHART, PCO2ART, PO2ART, HCO3, O2SAT in the last 168 hours.  Liver Function Tests: Recent Labs  Lab 05/15/18 0657 05/17/18 0534 05/19/18 0729  AST 28  --   --   ALT 20  --   --   ALKPHOS 78  --   --   BILITOT 0.4  --   --   PROT 6.8  --   --   ALBUMIN 2.0* 2.1* 2.0*   No results for input(s): LIPASE, AMYLASE in the last 168 hours. No results for input(s): AMMONIA in the last 168 hours.  CBC: Recent Labs  Lab 05/15/18 0900  05/16/18 0504 05/17/18 0534 05/18/18 0529 05/19/18 0729  WBC 11.3* 10.0 11.8* 11.3* 11.9*  HGB 7.0* 6.8* 8.5* 7.8* 8.0*  HCT 22.3* 21.8* 26.3* 24.9* 25.5*  MCV 103.2* 104.8* 100.4* 102.0* 102.4*  PLT 234 219 220 210 243    Cardiac Enzymes: No results for input(s): CKTOTAL, CKMB, CKMBINDEX, TROPONINI in the last 168 hours.  BNP (last 3 results) No results for input(s): BNP in the last 8760 hours.  ProBNP (last 3 results) No results for input(s): PROBNP in the last 8760 hours.  Radiological Exams: No results found.  Assessment/Plan Active Problems:   Acute on chronic respiratory failure with hypoxia (HCC)   Seizure disorder (HCC)   End stage renal disease on dialysis ([place])   Lobar pneumonia (HCC)   Chronic systolic heart failure ([place])   1. Acute on chronic respiratory failure with hypoxia patient is tolerating pressure support fairly well we will continue with pressure support at this time. 2. End-stage renal disease on hemodialysis followed by nephrology 3. Seizure disorder no active seizures 4. Lobar pneumonia treated 5. Chronic systolic heart failure manage fluid status per nephrology   I have personally seen and evaluated the patient, evaluated laboratory and imaging results, formulated the assessment and plan and placed orders. The Patient requires high complexity decision  making for assessment and support.  Case was discussed on Rounds with the Respiratory Therapy Staff  Allyne Gee, MD [facility] Pulmonary Critical Care Medicine Sleep Medicine

## 2018-05-19 NOTE — Progress Notes (Signed)
[facility]  ROUNDING NOTE   Subjective:  Patient seen at bedside. He is seen and evaluated during dialysis treatment. Tolerating well so far.  Objective:  Vital signs in last 24 hours:  Temperature 99 pulse 66 respirations 24 blood pressure 152/59  Physical Exam: General: Critically ill-appearing  Head: ETT/NG in place  Eyes: Anicteric  Neck: Supple, trachea midline  Lungs:  Scattered rhonchi, vent assisted  Heart: S1S2 no rubs  Abdomen:  Soft, nontender, bowel sounds present  Extremities: trace peripheral edema.  Neurologic: Arousable, not following commands  Skin: No lesions  Access: R IJ permcath    Basic Metabolic Panel: Recent Labs  Lab 05/15/18 0657 05/17/18 0534 05/18/18 0529  NA 130* 130* 131*  K 4.8 4.7 4.4  CL 95* 95* 94*  CO2 22 25 22   GLUCOSE 102* 107* 96  BUN 79* 64* 44*  CREATININE 9.16* 8.12* 6.13*  CALCIUM 8.7* 9.0 9.1  PHOS 5.0* 4.9*  --     Liver Function Tests: Recent Labs  Lab 05/15/18 0657 05/17/18 0534  AST 28  --   ALT 20  --   ALKPHOS 78  --   BILITOT 0.4  --   PROT 6.8  --   ALBUMIN 2.0* 2.1*   No results for input(s): LIPASE, AMYLASE in the last 168 hours. No results for input(s): AMMONIA in the last 168 hours.  CBC: Recent Labs  Lab 05/13/18 0615 05/15/18 0900 05/16/18 0504 05/17/18 0534 05/18/18 0529  WBC 9.9 11.3* 10.0 11.8* 11.3*  HGB 7.1* 7.0* 6.8* 8.5* 7.8*  HCT 22.9* 22.3* 21.8* 26.3* 24.9*  MCV 107.0* 103.2* 104.8* 100.4* 102.0*  PLT 229 234 219 220 210    Cardiac Enzymes: No results for input(s): CKTOTAL, CKMB, CKMBINDEX, TROPONINI in the last 168 hours.  BNP: Invalid input(s): POCBNP  CBG: No results for input(s): GLUCAP in the last 168 hours.  Microbiology: Results for orders placed or performed during the hospital encounter of 05/03/18  C difficile quick scan w PCR reflex     Status: Abnormal   Collection Time: 05/03/18  7:40 PM  Result Value Ref Range Status   C Diff antigen  POSITIVE (A) NEGATIVE Final   C Diff toxin NEGATIVE NEGATIVE Final   C Diff interpretation Results are indeterminate. See PCR results.  Final    Comment: Performed at [facility], [place] [address], [place], [place] [address]  C. Diff by PCR, Reflexed     Status: Abnormal   Collection Time: 05/03/18  7:40 PM  Result Value Ref Range Status   Toxigenic C. Difficile by PCR POSITIVE (A) NEGATIVE Final    Comment: Positive for toxigenic C. difficile with little to no toxin production. Only treat if clinical presentation suggests symptomatic illness. Performed at [facility], [place] [address] [place], [place] [address]   Expectorated sputum assessment w rflx to resp cult     Status: None   Collection Time: 05/04/18  9:21 AM  Result Value Ref Range Status   Specimen Description EXPECTORATED SPUTUM  Final   Special Requests NONE  Final   Sputum evaluation   Final    THIS SPECIMEN IS ACCEPTABLE FOR SPUTUM CULTURE Performed at [facility], [place] [address] [place], [place] [address]    Report Status 05/04/2018 FINAL  Final  Culture, respiratory     Status: None   Collection Time: 05/04/18  9:21 AM  Result Value Ref Range Status   Specimen Description EXPECTORATED SPUTUM  Final   Special Requests NONE Reflexed from [id number]  Final   Gram Stain   Final    ABUNDANT WBC PRESENT, PREDOMINANTLY PMN RARE SQUAMOUS EPITHELIAL CELLS PRESENT NO ORGANISMS SEEN    Culture   Final    FEW Consistent with normal respiratory flora. Performed at [facility], [place] [address] [place], [place] [address]    Report Status 05/06/2018 FINAL  Final  Culture, blood (routine x 2)     Status: None   Collection Time: 05/11/18  1:40 PM  Result Value Ref Range Status   Specimen Description BLOOD RIGHT HAND  Final   Special Requests   Final    BOTTLES DRAWN AEROBIC ONLY Blood Culture results may not be optimal due to an inadequate volume of blood received in culture bottles   Culture   Final     NO GROWTH 5 DAYS Performed at [facility], [place] [address] [place], [place] [address]    Report Status 05/16/2018 FINAL  Final  Culture, blood (routine x 2)     Status: None   Collection Time: 05/11/18  1:45 PM  Result Value Ref Range Status   Specimen Description BLOOD RIGHT HAND  Final   Special Requests   Final    BOTTLES DRAWN AEROBIC ONLY Blood Culture results may not be optimal due to an inadequate volume of blood received in culture bottles   Culture   Final    NO GROWTH 5 DAYS Performed at [facility], [place] [address] [place], [place] [address]    Report Status 05/16/2018 FINAL  Final  Culture, respiratory     Status: None   Collection Time: 05/11/18  4:20 PM  Result Value Ref Range Status   Specimen Description SPUTUM  Final   Special Requests NONE  Final   Gram Stain   Final    ABUNDANT WBC PRESENT,BOTH PMN AND MONONUCLEAR NO ORGANISMS SEEN Performed at [facility], [address] [place], [place] [address]    Culture FEW ENTEROCOCCUS FAECALIS  Final   Report Status 05/13/2018 FINAL  Final   Organism ID, Bacteria ENTEROCOCCUS FAECALIS  Final      Susceptibility   Enterococcus faecalis - MIC*    AMPICILLIN <=2 SENSITIVE Sensitive     VANCOMYCIN 2 SENSITIVE Sensitive     GENTAMICIN SYNERGY SENSITIVE Sensitive     * FEW ENTEROCOCCUS FAECALIS    Coagulation Studies: No results for input(s): LABPROT, INR in the last 72 hours.  Urinalysis: No results for input(s): COLORURINE, LABSPEC, PHURINE, GLUCOSEU, HGBUR, BILIRUBINUR, KETONESUR, PROTEINUR, UROBILINOGEN, NITRITE, LEUKOCYTESUR in the last 72 hours.  Invalid input(s): APPERANCEUR    Imaging: No results found.   Medications:       Assessment/ Plan:  73 y.o. male with a PMHx of COPD, end-stage renal disease, hypertension, hepatitis C, cirrhosis, chronic systolic heart failure, depression, anemia of chronic kidney disease, secondary hyperparathyroidism, seizure disorder, who was  admitted to [facility] on 05/03/2018 for ongoing treatment of respiratory failure and end-stage renal disease.   1.  ESRD on HD.    Patient seen and evaluated during dialysis treatment.  He appears to be tolerating this well.  We plan to complete dialysis treatment today and we will plan for dialysis again on Monday.  2.  Acute respiratory failure.    Patient continues to be difficult to wean from the ventilator.  Tracheostomy remains on consideration.  3.  Anemia of chronic kidney disease.  Hemoglobin down to 7.8.  Was transfused earlier in the week.  Continue to monitor CBC closely.  4.  Secondary hyperparathyroidism.    Phosphorus currently 4.9 and at target.  Repeat this today.   LOS: 0 Aliany Fiorenza 3/20/20208:53 AM

## 2018-05-20 DIAGNOSIS — J9621 Acute and chronic respiratory failure with hypoxia: Secondary | ICD-10-CM | POA: Diagnosis not present

## 2018-05-20 DIAGNOSIS — J181 Lobar pneumonia, unspecified organism: Secondary | ICD-10-CM | POA: Diagnosis not present

## 2018-05-20 DIAGNOSIS — I5022 Chronic systolic (congestive) heart failure: Secondary | ICD-10-CM | POA: Diagnosis not present

## 2018-05-20 DIAGNOSIS — N186 End stage renal disease: Secondary | ICD-10-CM | POA: Diagnosis not present

## 2018-05-20 NOTE — Progress Notes (Signed)
Pulmonary Critical Care Medicine [place]   PULMONARY CRITICAL CARE SERVICE  PROGRESS NOTE  Date of Service: 05/20/2018  Jared Hancock  TDS:[id number]  DOB: 09/23/1945   DOA: 05/03/2018  Referring Physician: Merton Border, MD  HPI: Jared Hancock is a 73 y.o. male seen for follow up of Acute on Chronic Respiratory Failure.  Patient is orally intubated remains on pressure support mode at this time.  Right now is comfortable without distress  Medications: Reviewed on Rounds  Physical Exam:  Vitals: Temperature 98.9 pulse 70 respiratory rate 17 blood pressure 135/67 saturations 98%  Ventilator Settings mode ventilation pressure support FiO2 28% tidal volume 512 pressure support 12 PEEP 5  . General: Comfortable at this time . Eyes: Grossly normal lids, irises & conjunctiva . ENT: grossly tongue is normal . Neck: no obvious mass . Cardiovascular: S1 S2 normal no gallop . Respiratory: No rhonchi or rales are noted at this time . Abdomen: soft . Skin: no rash seen on limited exam . Musculoskeletal: not rigid . Psychiatric:unable to assess . Neurologic: no seizure no involuntary movements         Lab Data:   Basic Metabolic Panel: Recent Labs  Lab 05/15/18 0657 05/17/18 0534 05/18/18 0529 05/19/18 0729  NA 130* 130* 131* 131*  K 4.8 4.7 4.4 4.3  CL 95* 95* 94* 92*  CO2 22 25 22 24   GLUCOSE 102* 107* 96 105*  BUN 79* 64* 44* 65*  CREATININE 9.16* 8.12* 6.13* 8.02*  CALCIUM 8.7* 9.0 9.1 9.1  PHOS 5.0* 4.9*  --  4.8*    ABG: No results for input(s): PHART, PCO2ART, PO2ART, HCO3, O2SAT in the last 168 hours.  Liver Function Tests: Recent Labs  Lab 05/15/18 0657 05/17/18 0534 05/19/18 0729  AST 28  --   --   ALT 20  --   --   ALKPHOS 78  --   --   BILITOT 0.4  --   --   PROT 6.8  --   --   ALBUMIN 2.0* 2.1* 2.0*   No results for input(s): LIPASE, AMYLASE in the last 168 hours. No results for input(s): AMMONIA in the last 168  hours.  CBC: Recent Labs  Lab 05/15/18 0900 05/16/18 0504 05/17/18 0534 05/18/18 0529 05/19/18 0729  WBC 11.3* 10.0 11.8* 11.3* 11.9*  HGB 7.0* 6.8* 8.5* 7.8* 8.0*  HCT 22.3* 21.8* 26.3* 24.9* 25.5*  MCV 103.2* 104.8* 100.4* 102.0* 102.4*  PLT 234 219 220 210 243    Cardiac Enzymes: No results for input(s): CKTOTAL, CKMB, CKMBINDEX, TROPONINI in the last 168 hours.  BNP (last 3 results) No results for input(s): BNP in the last 8760 hours.  ProBNP (last 3 results) No results for input(s): PROBNP in the last 8760 hours.  Radiological Exams: No results found.  Assessment/Plan Active Problems:   Acute on chronic respiratory failure with hypoxia (HCC)   Seizure disorder (HCC)   End stage renal disease on dialysis ([place])   Lobar pneumonia (HCC)   Chronic systolic heart failure ([place])   1. Acute on chronic respiratory failure with hypoxia we will continue with full support on ventilator patient has been on continuous pressure support which he is tolerating well.  Still awaiting possibility of tracheostomy 2. Seizure disorder no active seizures are noted 3. End-stage renal disease on hemodialysis continue present management 4. Lobar pneumonia treated continue with supportive care 5. Chronic systolic heart failure at baseline continue present management   I have personally  seen and evaluated the patient, evaluated laboratory and imaging results, formulated the assessment and plan and placed orders. The Patient requires high complexity decision making for assessment and support.  Case was discussed on Rounds with the Respiratory Therapy Staff  Allyne Gee, MD [facility] Pulmonary Critical Care Medicine Sleep Medicine

## 2018-05-21 DIAGNOSIS — I5022 Chronic systolic (congestive) heart failure: Secondary | ICD-10-CM | POA: Diagnosis not present

## 2018-05-21 DIAGNOSIS — J9621 Acute and chronic respiratory failure with hypoxia: Secondary | ICD-10-CM | POA: Diagnosis not present

## 2018-05-21 DIAGNOSIS — J181 Lobar pneumonia, unspecified organism: Secondary | ICD-10-CM | POA: Diagnosis not present

## 2018-05-21 DIAGNOSIS — N186 End stage renal disease: Secondary | ICD-10-CM | POA: Diagnosis not present

## 2018-05-21 LAB — BLOOD GAS, ARTERIAL
ACID-BASE EXCESS: 1 mmol/L (ref 0.0–2.0)
BICARBONATE: 25.3 mmol/L (ref 20.0–28.0)
O2 Content: 3 L/min
O2 Saturation: 97.5 %
Patient temperature: 98.6
pCO2 arterial: 42.3 mmHg (ref 32.0–48.0)
pH, Arterial: 7.395 (ref 7.350–7.450)
pO2, Arterial: 98.9 mmHg (ref 83.0–108.0)

## 2018-05-21 NOTE — Progress Notes (Signed)
Pulmonary Critical Care Medicine [place]   PULMONARY CRITICAL CARE SERVICE  PROGRESS NOTE  Date of Service: 05/21/2018  Jared Hancock  IPJ:[id number]  DOB: 08/19/1945   DOA: 05/03/2018  Referring Physician: Merton Border, MD  HPI: Jared Hancock is a 73 y.o. male seen for follow up of Acute on Chronic Respiratory Failure.  Patient is extubated is actually doing quite well tolerating the BiPAP right now is on 14/8  Medications: Reviewed on Rounds  Physical Exam:  Vitals: Temperature 98.0 pulse 60 respiratory rate 18 blood pressure 163/73 saturations 98%  Ventilator Settings on BiPAP 14/8 tidal volume 543 and currently on 30% FiO2  . General: Comfortable at this time . Eyes: Grossly normal lids, irises & conjunctiva . ENT: grossly tongue is normal . Neck: no obvious mass . Cardiovascular: S1 S2 normal no gallop . Respiratory: No rhonchi or rales are noted at this time . Abdomen: soft . Skin: no rash seen on limited exam . Musculoskeletal: not rigid . Psychiatric:unable to assess . Neurologic: no seizure no involuntary movements         Lab Data:   Basic Metabolic Panel: Recent Labs  Lab 05/15/18 0657 05/17/18 0534 05/18/18 0529 05/19/18 0729  NA 130* 130* 131* 131*  K 4.8 4.7 4.4 4.3  CL 95* 95* 94* 92*  CO2 22 25 22 24   GLUCOSE 102* 107* 96 105*  BUN 79* 64* 44* 65*  CREATININE 9.16* 8.12* 6.13* 8.02*  CALCIUM 8.7* 9.0 9.1 9.1  PHOS 5.0* 4.9*  --  4.8*    ABG: No results for input(s): PHART, PCO2ART, PO2ART, HCO3, O2SAT in the last 168 hours.  Liver Function Tests: Recent Labs  Lab 05/15/18 0657 05/17/18 0534 05/19/18 0729  AST 28  --   --   ALT 20  --   --   ALKPHOS 78  --   --   BILITOT 0.4  --   --   PROT 6.8  --   --   ALBUMIN 2.0* 2.1* 2.0*   No results for input(s): LIPASE, AMYLASE in the last 168 hours. No results for input(s): AMMONIA in the last 168 hours.  CBC: Recent Labs  Lab 05/15/18 0900 05/16/18 0504  05/17/18 0534 05/18/18 0529 05/19/18 0729  WBC 11.3* 10.0 11.8* 11.3* 11.9*  HGB 7.0* 6.8* 8.5* 7.8* 8.0*  HCT 22.3* 21.8* 26.3* 24.9* 25.5*  MCV 103.2* 104.8* 100.4* 102.0* 102.4*  PLT 234 219 220 210 243    Cardiac Enzymes: No results for input(s): CKTOTAL, CKMB, CKMBINDEX, TROPONINI in the last 168 hours.  BNP (last 3 results) No results for input(s): BNP in the last 8760 hours.  ProBNP (last 3 results) No results for input(s): PROBNP in the last 8760 hours.  Radiological Exams: No results found.  Assessment/Plan Active Problems:   Acute on chronic respiratory failure with hypoxia (HCC)   Seizure disorder (HCC)   End stage renal disease on dialysis ([place])   Lobar pneumonia (HCC)   Chronic systolic heart failure ([place])   1. Acute on chronic respiratory failure with hypoxia continue with BiPAP therapy continue secretion management supportive care 2. Seizure disorder no active seizures are noted at this time. 3. End-stage renal failure on hemodialysis which will be continued. 4. Chronic systolic heart failure monitor fluid status continue with supportive care   I have personally seen and evaluated the patient, evaluated laboratory and imaging results, formulated the assessment and plan and placed orders. The Patient requires high complexity decision making  for assessment and support.  Case was discussed on Rounds with the Respiratory Therapy Staff  Allyne Gee, MD [facility] Pulmonary Critical Care Medicine Sleep Medicine

## 2018-05-22 DIAGNOSIS — J181 Lobar pneumonia, unspecified organism: Secondary | ICD-10-CM | POA: Diagnosis not present

## 2018-05-22 DIAGNOSIS — N186 End stage renal disease: Secondary | ICD-10-CM | POA: Diagnosis not present

## 2018-05-22 DIAGNOSIS — I5022 Chronic systolic (congestive) heart failure: Secondary | ICD-10-CM | POA: Diagnosis not present

## 2018-05-22 DIAGNOSIS — J9621 Acute and chronic respiratory failure with hypoxia: Secondary | ICD-10-CM | POA: Diagnosis not present

## 2018-05-22 LAB — RENAL FUNCTION PANEL
Albumin: 2.1 g/dL — ABNORMAL LOW (ref 3.5–5.0)
Anion gap: 14 (ref 5–15)
BUN: 75 mg/dL — ABNORMAL HIGH (ref 8–23)
CHLORIDE: 92 mmol/L — AB (ref 98–111)
CO2: 23 mmol/L (ref 22–32)
Calcium: 9 mg/dL (ref 8.9–10.3)
Creatinine, Ser: 8.51 mg/dL — ABNORMAL HIGH (ref 0.61–1.24)
GFR calc Af Amer: 7 mL/min — ABNORMAL LOW (ref >60)
GFR calc non Af Amer: 6 mL/min — ABNORMAL LOW (ref >60)
Glucose, Bld: 108 mg/dL — ABNORMAL HIGH (ref 70–99)
POTASSIUM: 4.9 mmol/L (ref 3.5–5.1)
Phosphorus: 5.7 mg/dL — ABNORMAL HIGH (ref 2.5–4.6)
Sodium: 129 mmol/L — ABNORMAL LOW (ref 135–145)

## 2018-05-22 LAB — CBC
HCT: 27.8 % — ABNORMAL LOW (ref 39.0–52.0)
Hemoglobin: 8.8 g/dL — ABNORMAL LOW (ref 13.0–17.0)
MCH: 31.8 pg (ref 26.0–34.0)
MCHC: 31.7 g/dL (ref 30.0–36.0)
MCV: 100.4 fL — ABNORMAL HIGH (ref 80.0–100.0)
Platelets: 251 10*3/uL (ref 150–400)
RBC: 2.77 MIL/uL — ABNORMAL LOW (ref 4.22–5.81)
RDW: 17.3 % — ABNORMAL HIGH (ref 11.5–15.5)
WBC: 8.6 10*3/uL (ref 4.0–10.5)
nRBC: 0 % (ref 0.0–0.2)

## 2018-05-22 LAB — C DIFFICILE QUICK SCREEN W PCR REFLEX
C Diff antigen: POSITIVE — AB
C Diff toxin: NEGATIVE

## 2018-05-22 LAB — CLOSTRIDIUM DIFFICILE BY PCR, REFLEXED: Toxigenic C. Difficile by PCR: POSITIVE — AB

## 2018-05-22 NOTE — Progress Notes (Signed)
[facility]  ROUNDING NOTE   Subjective:  Patient now extubated. Resting comfortably in bed. Due for dialysis today.  Objective:  Vital signs in last 24 hours:  Temperature 97.7 pulse 70 respirations 12 blood pressure 163/78  Physical Exam: General: Critically ill-appearing  Head: ETT/NG in place  Eyes: Anicteric  Neck: Supple, trachea midline  Lungs:  Scattered rhonchi, normal effort  Heart: S1S2 no rubs  Abdomen:  Soft, nontender, bowel sounds present  Extremities: trace peripheral edema.  Neurologic:  Awake, alert  Skin: No lesions  Access: R IJ permcath    Basic Metabolic Panel: Recent Labs  Lab 05/17/18 0534 05/18/18 0529 05/19/18 0729 05/22/18 0503  NA 130* 131* 131* 129*  K 4.7 4.4 4.3 4.9  CL 95* 94* 92* 92*  CO2 25 22 24 23   GLUCOSE 107* 96 105* 108*  BUN 64* 44* 65* 75*  CREATININE 8.12* 6.13* 8.02* 8.51*  CALCIUM 9.0 9.1 9.1 9.0  PHOS 4.9*  --  4.8* 5.7*    Liver Function Tests: Recent Labs  Lab 05/17/18 0534 05/19/18 0729 05/22/18 0503  ALBUMIN 2.1* 2.0* 2.1*   No results for input(s): LIPASE, AMYLASE in the last 168 hours. No results for input(s): AMMONIA in the last 168 hours.  CBC: Recent Labs  Lab 05/16/18 0504 05/17/18 0534 05/18/18 0529 05/19/18 0729 05/22/18 0503  WBC 10.0 11.8* 11.3* 11.9* 8.6  HGB 6.8* 8.5* 7.8* 8.0* 8.8*  HCT 21.8* 26.3* 24.9* 25.5* 27.8*  MCV 104.8* 100.4* 102.0* 102.4* 100.4*  PLT 219 220 210 243 251    Cardiac Enzymes: No results for input(s): CKTOTAL, CKMB, CKMBINDEX, TROPONINI in the last 168 hours.  BNP: Invalid input(s): POCBNP  CBG: No results for input(s): GLUCAP in the last 168 hours.  Microbiology: Results for orders placed or performed during the hospital encounter of 05/03/18  C difficile quick scan w PCR reflex     Status: Abnormal   Collection Time: 05/03/18  7:40 PM  Result Value Ref Range Status   C Diff antigen POSITIVE (A) NEGATIVE Final   C Diff toxin NEGATIVE  NEGATIVE Final   C Diff interpretation Results are indeterminate. See PCR results.  Final    Comment: Performed at [facility], [place] [address], [place], [place] [address]  C. Diff by PCR, Reflexed     Status: Abnormal   Collection Time: 05/03/18  7:40 PM  Result Value Ref Range Status   Toxigenic C. Difficile by PCR POSITIVE (A) NEGATIVE Final    Comment: Positive for toxigenic C. difficile with little to no toxin production. Only treat if clinical presentation suggests symptomatic illness. Performed at [facility], [place] [address] [place], [place] [address]   Expectorated sputum assessment w rflx to resp cult     Status: None   Collection Time: 05/04/18  9:21 AM  Result Value Ref Range Status   Specimen Description EXPECTORATED SPUTUM  Final   Special Requests NONE  Final   Sputum evaluation   Final    THIS SPECIMEN IS ACCEPTABLE FOR SPUTUM CULTURE Performed at [facility], [place] [address], [place], [place] [address]    Report Status 05/04/2018 FINAL  Final  Culture, respiratory     Status: None   Collection Time: 05/04/18  9:21 AM  Result Value Ref Range Status   Specimen Description EXPECTORATED SPUTUM  Final   Special Requests NONE Reflexed from [id number]  Final   Gram Stain   Final  ABUNDANT WBC PRESENT, PREDOMINANTLY PMN RARE SQUAMOUS EPITHELIAL CELLS PRESENT NO ORGANISMS SEEN    Culture   Final    FEW Consistent with normal respiratory flora. Performed at [facility], [place] [address] [place], [place] [address]    Report Status 05/06/2018 FINAL  Final  Culture, blood (routine x 2)     Status: None   Collection Time: 05/11/18  1:40 PM  Result Value Ref Range Status   Specimen Description BLOOD RIGHT HAND  Final   Special Requests   Final    BOTTLES DRAWN AEROBIC ONLY Blood Culture results may not be optimal due to an inadequate volume of blood received in culture bottles   Culture   Final    NO GROWTH 5 DAYS Performed at [place], [place] [address] [place], [place] [address]    Report Status 05/16/2018 FINAL  Final  Culture, blood (routine x 2)     Status: None   Collection Time: 05/11/18  1:45 PM  Result Value Ref Range Status   Specimen Description BLOOD RIGHT HAND  Final   Special Requests   Final    BOTTLES DRAWN AEROBIC ONLY Blood Culture results may not be optimal due to an inadequate volume of blood received in culture bottles   Culture   Final    NO GROWTH 5 DAYS Performed at [facility], [place] [address] [place], [place] [address]    Report Status 05/16/2018 FINAL  Final  Culture, respiratory     Status: None   Collection Time: 05/11/18  4:20 PM  Result Value Ref Range Status   Specimen Description SPUTUM  Final   Special Requests NONE  Final   Gram Stain   Final    ABUNDANT WBC PRESENT,BOTH PMN AND MONONUCLEAR NO ORGANISMS SEEN Performed at [facility], [address] [place], [place] [address]    Culture FEW ENTEROCOCCUS FAECALIS  Final   Report Status 05/13/2018 FINAL  Final   Organism ID, Bacteria ENTEROCOCCUS FAECALIS  Final      Susceptibility   Enterococcus faecalis - MIC*    AMPICILLIN <=2 SENSITIVE Sensitive     VANCOMYCIN 2 SENSITIVE Sensitive     GENTAMICIN SYNERGY SENSITIVE Sensitive     * FEW ENTEROCOCCUS FAECALIS    Coagulation Studies: No results for input(s): LABPROT, INR in the last 72 hours.  Urinalysis: No results for input(s): COLORURINE, LABSPEC, PHURINE, GLUCOSEU, HGBUR, BILIRUBINUR, KETONESUR, PROTEINUR, UROBILINOGEN, NITRITE, LEUKOCYTESUR in the last 72 hours.  Invalid input(s): APPERANCEUR    Imaging: No results found.   Medications:       Assessment/ Plan:  73 y.o. male with a PMHx of COPD, end-stage renal disease, hypertension, hepatitis C, cirrhosis, chronic systolic heart failure, depression, anemia of chronic kidney disease, secondary hyperparathyroidism, seizure disorder, who was admitted to [facility] on 05/03/2018 for ongoing treatment of  respiratory failure and end-stage renal disease.   1.  ESRD on HD.    Patient due for dialysis today.  Orders have been prepared.  2.  Acute respiratory failure.    Patient now weaned from the ventilator and doing well at the moment.  Continue to monitor respiratory status closely.  3.  Anemia of chronic kidney disease.    Hemoglobin 8.8.  Was transfused last week.  Continue to monitor CBC.  4.  Secondary hyperparathyroidism.    Phosphorus currently 5.7.  This should come down with ongoing dialysis.   LOS: 0 Shena Vinluan 3/23/20208:14 AM

## 2018-05-22 NOTE — Progress Notes (Addendum)
Pulmonary Critical Care Medicine [place]   PULMONARY CRITICAL CARE SERVICE  PROGRESS NOTE  Date of Service: 05/22/2018  Jared Hancock  OBS:[id number]  DOB: April 21, 1945   DOA: 05/03/2018  Referring Physician: Merton Border, MD  HPI: Jared Hancock is a 73 y.o. male seen for follow up of Acute on Chronic Respiratory Failure.  Patient was extubated yesterday and was placed on BiPAP overnight.  He proceeded to remove his BiPAP mask so much that it was broken.  Respiratory therapy finally gave up and placed the patient on 2 L of oxygen via nasal cannula sometime overnight.  Surprisingly, he is doing well with this and has required no further oxygenation at this point.  Medications: Reviewed on Rounds  Physical Exam:  Vitals: Pulse 70 respirations 12 BP 163/78 O2 sat 100% temp 97.7  Ventilator Settings patient's not currently on ventilator  . General: Comfortable at this time . Eyes: Grossly normal lids, irises & conjunctiva . ENT: grossly tongue is normal . Neck: no obvious mass . Cardiovascular: S1 S2 normal no gallop . Respiratory: No rales or rhonchi noted . Abdomen: soft . Skin: no rash seen on limited exam . Musculoskeletal: not rigid . Psychiatric:unable to assess . Neurologic: no seizure no involuntary movements         Lab Data:   Basic Metabolic Panel: Recent Labs  Lab 05/17/18 0534 05/18/18 0529 05/19/18 0729 05/22/18 0503  NA 130* 131* 131* 129*  K 4.7 4.4 4.3 4.9  CL 95* 94* 92* 92*  CO2 25 22 24 23   GLUCOSE 107* 96 105* 108*  BUN 64* 44* 65* 75*  CREATININE 8.12* 6.13* 8.02* 8.51*  CALCIUM 9.0 9.1 9.1 9.0  PHOS 4.9*  --  4.8* 5.7*    ABG: Recent Labs  Lab 05/21/18 2250  PHART 7.395  PCO2ART 42.3  PO2ART 98.9  HCO3 25.3  O2SAT 97.5    Liver Function Tests: Recent Labs  Lab 05/17/18 0534 05/19/18 0729 05/22/18 0503  ALBUMIN 2.1* 2.0* 2.1*   No results for input(s): LIPASE, AMYLASE in the last 168 hours. No results for  input(s): AMMONIA in the last 168 hours.  CBC: Recent Labs  Lab 05/16/18 0504 05/17/18 0534 05/18/18 0529 05/19/18 0729 05/22/18 0503  WBC 10.0 11.8* 11.3* 11.9* 8.6  HGB 6.8* 8.5* 7.8* 8.0* 8.8*  HCT 21.8* 26.3* 24.9* 25.5* 27.8*  MCV 104.8* 100.4* 102.0* 102.4* 100.4*  PLT 219 220 210 243 251    Cardiac Enzymes: No results for input(s): CKTOTAL, CKMB, CKMBINDEX, TROPONINI in the last 168 hours.  BNP (last 3 results) No results for input(s): BNP in the last 8760 hours.  ProBNP (last 3 results) No results for input(s): PROBNP in the last 8760 hours.  Radiological Exams: No results found.  Assessment/Plan Active Problems:   Acute on chronic respiratory failure with hypoxia (HCC)   Seizure disorder (HCC)   End stage renal disease on dialysis ([place])   Lobar pneumonia (HCC)   Chronic systolic heart failure ([place])   1. Acute on chronic respiratory failure with hypoxia continue with BiPAP therapy if patient will tolerate.  Continue secretion management aggressive pulmonary toilet.  Otherwise patient can be on oxygen via nasal cannula. 2. Seizure disorder no active seizures noted at this time 3. Stage renal failure on hemodialysis which we continue 4. Chronic systolic heart failure monitor fluid status continue supportive care   I have personally seen and evaluated the patient, evaluated laboratory and imaging results, formulated the assessment and  plan and placed orders. The Patient requires high complexity decision making for assessment and support.  Case was discussed on Rounds with the Respiratory Therapy Staff  Allyne Gee, MD [facility] Pulmonary Critical Care Medicine Sleep Medicine

## 2018-05-23 ENCOUNTER — Encounter: Admission: AD | Disposition: A | Discharge status: Home Health | Payer: Self-pay | Attending: Internal Medicine

## 2018-05-23 DIAGNOSIS — I5022 Chronic systolic (congestive) heart failure: Secondary | ICD-10-CM | POA: Diagnosis not present

## 2018-05-23 DIAGNOSIS — N186 End stage renal disease: Secondary | ICD-10-CM | POA: Diagnosis not present

## 2018-05-23 DIAGNOSIS — J181 Lobar pneumonia, unspecified organism: Secondary | ICD-10-CM | POA: Diagnosis not present

## 2018-05-23 DIAGNOSIS — J9621 Acute and chronic respiratory failure with hypoxia: Secondary | ICD-10-CM | POA: Diagnosis not present

## 2018-05-23 SURGERY — CREATION, TRACHEOSTOMY
Anesthesia: General

## 2018-05-23 NOTE — Progress Notes (Addendum)
Pulmonary Critical Care Medicine [place]   PULMONARY CRITICAL CARE SERVICE  PROGRESS NOTE  Date of Service: 05/23/2018  Jared Hancock  RWE:[id number]  DOB: [birth year]   DOA: 05/03/2018  Referring Physician: Merton Border, MD  HPI: Jared Hancock is a 73 y.o. male seen for follow up of Acute on Chronic Respiratory Failure.  Patient is on room air at this time doing well.  No longer requiring any oxygen.  No acute distress is noted.  Medications: Reviewed on Rounds  Physical Exam:  Vitals: Pulse 70 respirations 20 BP 169/90 O2 sat 100% temp 98.0  Ventilator Settings not currently on ventilator  . General: Comfortable at this time . Eyes: Grossly normal lids, irises & conjunctiva . ENT: grossly tongue is normal . Neck: no obvious mass . Cardiovascular: S1 S2 normal no gallop . Respiratory: No rales or rhonchi noted . Abdomen: soft . Skin: no rash seen on limited exam . Musculoskeletal: not rigid . Psychiatric:unable to assess . Neurologic: no seizure no involuntary movements         Lab Data:   Basic Metabolic Panel: Recent Labs  Lab 05/17/18 0534 05/18/18 0529 05/19/18 0729 05/22/18 0503  NA 130* 131* 131* 129*  K 4.7 4.4 4.3 4.9  CL 95* 94* 92* 92*  CO2 25 22 24 23   GLUCOSE 107* 96 105* 108*  BUN 64* 44* 65* 75*  CREATININE 8.12* 6.13* 8.02* 8.51*  CALCIUM 9.0 9.1 9.1 9.0  PHOS 4.9*  --  4.8* 5.7*    ABG: Recent Labs  Lab 05/21/18 2250  PHART 7.395  PCO2ART 42.3  PO2ART 98.9  HCO3 25.3  O2SAT 97.5    Liver Function Tests: Recent Labs  Lab 05/17/18 0534 05/19/18 0729 05/22/18 0503  ALBUMIN 2.1* 2.0* 2.1*   No results for input(s): LIPASE, AMYLASE in the last 168 hours. No results for input(s): AMMONIA in the last 168 hours.  CBC: Recent Labs  Lab 05/17/18 0534 05/18/18 0529 05/19/18 0729 05/22/18 0503  WBC 11.8* 11.3* 11.9* 8.6  HGB 8.5* 7.8* 8.0* 8.8*  HCT 26.3* 24.9* 25.5* 27.8*  MCV 100.4* 102.0* 102.4* 100.4*   PLT 220 210 243 251    Cardiac Enzymes: No results for input(s): CKTOTAL, CKMB, CKMBINDEX, TROPONINI in the last 168 hours.  BNP (last 3 results) No results for input(s): BNP in the last 8760 hours.  ProBNP (last 3 results) No results for input(s): PROBNP in the last 8760 hours.  Radiological Exams: No results found.  Assessment/Plan Active Problems:   Acute on chronic respiratory failure with hypoxia (HCC)   Seizure disorder (HCC)   End stage renal disease on dialysis ([place])   Lobar pneumonia (HCC)   Chronic systolic heart failure ([place])   1. Acute on chronic respiratory failure with hypoxia continue with BiPAP therapy if patient will tolerate/cooperate.  Continue secretion management aggressive pulmonary toilet.  Otherwise patient is welcome to use oxygen via nasal cannula.  Right now on room air and doing well. 2. Seizure disorder no active seizures noted at this time 3. End-stage renal failure on hemodialysis.  Continue supportive care 4. Chronic systolic heart failure monitor fluid status continue supportive care   I have personally seen and evaluated the patient, evaluated laboratory and imaging results, formulated the assessment and plan and placed orders. The Patient requires high complexity decision making for assessment and support.  Case was discussed on Rounds with the Respiratory Therapy Staff  Allyne Gee, MD [facility] Pulmonary Critical Care Medicine  Sleep Medicine

## 2018-05-24 ENCOUNTER — Other Ambulatory Visit (HOSPITAL_COMMUNITY): Payer: Medicaid Other

## 2018-05-24 DIAGNOSIS — N186 End stage renal disease: Secondary | ICD-10-CM | POA: Diagnosis not present

## 2018-05-24 DIAGNOSIS — I5022 Chronic systolic (congestive) heart failure: Secondary | ICD-10-CM | POA: Diagnosis not present

## 2018-05-24 DIAGNOSIS — J181 Lobar pneumonia, unspecified organism: Secondary | ICD-10-CM | POA: Diagnosis not present

## 2018-05-24 DIAGNOSIS — J9621 Acute and chronic respiratory failure with hypoxia: Secondary | ICD-10-CM | POA: Diagnosis not present

## 2018-05-24 LAB — CBC
HEMATOCRIT: 28.1 % — AB (ref 39.0–52.0)
Hemoglobin: 8.6 g/dL — ABNORMAL LOW (ref 13.0–17.0)
MCH: 30.9 pg (ref 26.0–34.0)
MCHC: 30.6 g/dL (ref 30.0–36.0)
MCV: 101.1 fL — AB (ref 80.0–100.0)
Platelets: 240 10*3/uL (ref 150–400)
RBC: 2.78 MIL/uL — ABNORMAL LOW (ref 4.22–5.81)
RDW: 17.9 % — ABNORMAL HIGH (ref 11.5–15.5)
WBC: 6.2 10*3/uL (ref 4.0–10.5)
nRBC: 0 % (ref 0.0–0.2)

## 2018-05-24 LAB — RENAL FUNCTION PANEL
Albumin: 2.4 g/dL — ABNORMAL LOW (ref 3.5–5.0)
Anion gap: 12 (ref 5–15)
BUN: 17 mg/dL (ref 8–23)
CHLORIDE: 96 mmol/L — AB (ref 98–111)
CO2: 26 mmol/L (ref 22–32)
Calcium: 9.3 mg/dL (ref 8.9–10.3)
Creatinine, Ser: 3.64 mg/dL — ABNORMAL HIGH (ref 0.61–1.24)
GFR calc Af Amer: 18 mL/min — ABNORMAL LOW (ref >60)
GFR calc non Af Amer: 16 mL/min — ABNORMAL LOW (ref >60)
Glucose, Bld: 95 mg/dL (ref 70–99)
Phosphorus: 2.4 mg/dL — ABNORMAL LOW (ref 2.5–4.6)
Potassium: 4 mmol/L (ref 3.5–5.1)
Sodium: 134 mmol/L — ABNORMAL LOW (ref 135–145)

## 2018-05-24 MED ORDER — GENERIC EXTERNAL MEDICATION
Status: DC
Start: ? — End: 2018-05-24

## 2018-05-24 NOTE — Progress Notes (Addendum)
Pulmonary Critical Care Medicine [place]   PULMONARY CRITICAL CARE SERVICE  PROGRESS NOTE  Date of Service: 05/24/2018  KYLIL Hancock  IRW:[id number]  DOB: [birth year]   DOA: 05/03/2018  Referring Physician: Merton Border, MD  HPI: Jared Hancock is a 73 y.o. male seen for follow up of Acute on Chronic Respiratory Failure.  Patient is doing well on room air and his BiPAP has been discontinued.  No respiratory issues noted at this time.  Medications: Reviewed on Rounds  Physical Exam:  Vitals: Pulse 64 respirations 15 BP 191/76 O2 sat 97% temp 97.3  Ventilator Settings not currently on ventilator  . General: Comfortable at this time . Eyes: Grossly normal lids, irises & conjunctiva . ENT: grossly tongue is normal . Neck: no obvious mass . Cardiovascular: S1 S2 normal no gallop . Respiratory: No rales or rhonchi noted . Abdomen: soft . Skin: no rash seen on limited exam . Musculoskeletal: not rigid . Psychiatric:unable to assess . Neurologic: no seizure no involuntary movements         Lab Data:   Basic Metabolic Panel: Recent Labs  Lab 05/18/18 0529 05/19/18 0729 05/22/18 0503  NA 131* 131* 129*  K 4.4 4.3 4.9  CL 94* 92* 92*  CO2 22 24 23   GLUCOSE 96 105* 108*  BUN 44* 65* 75*  CREATININE 6.13* 8.02* 8.51*  CALCIUM 9.1 9.1 9.0  PHOS  --  4.8* 5.7*    ABG: Recent Labs  Lab 05/21/18 2250  PHART 7.395  PCO2ART 42.3  PO2ART 98.9  HCO3 25.3  O2SAT 97.5    Liver Function Tests: Recent Labs  Lab 05/19/18 0729 05/22/18 0503  ALBUMIN 2.0* 2.1*   No results for input(s): LIPASE, AMYLASE in the last 168 hours. No results for input(s): AMMONIA in the last 168 hours.  CBC: Recent Labs  Lab 05/18/18 0529 05/19/18 0729 05/22/18 0503  WBC 11.3* 11.9* 8.6  HGB 7.8* 8.0* 8.8*  HCT 24.9* 25.5* 27.8*  MCV 102.0* 102.4* 100.4*  PLT 210 243 251    Cardiac Enzymes: No results for input(s): CKTOTAL, CKMB, CKMBINDEX, TROPONINI in the  last 168 hours.  BNP (last 3 results) No results for input(s): BNP in the last 8760 hours.  ProBNP (last 3 results) No results for input(s): PROBNP in the last 8760 hours.  Radiological Exams: No results found.  Assessment/Plan Active Problems:   Acute on chronic respiratory failure with hypoxia (HCC)   Seizure disorder (HCC)   End stage renal disease on dialysis ([place])   Lobar pneumonia (HCC)   Chronic systolic heart failure ([place])   1. Acute on chronic respiratory failure with hypoxia patient is doing well on room air at this time.  Continue secretion management and pulmonary toilet. 2. Seizure disorder no active seizures noted at this time 3. End-stage renal failure on hemodialysis continue supportive care 4. Chronic systolic heart failure monitor fluid status continue supportive care   I have personally seen and evaluated the patient, evaluated laboratory and imaging results, formulated the assessment and plan and placed orders. The Patient requires high complexity decision making for assessment and support.  Case was discussed on Rounds with the Respiratory Therapy Staff  Allyne Gee, MD [facility] Pulmonary Critical Care Medicine Sleep Medicine

## 2018-05-24 NOTE — Progress Notes (Signed)
[facility]  ROUNDING NOTE   Subjective:  Patient seen and evaluated during hemodialysis. Tolerating well. Resting comfortably in bed.  Objective:  Vital signs in last 24 hours:  Temperature 97.3 pulse 64 respirations 15 blood pressure 181/76  Physical Exam: General: Critically ill-appearing  Head: [place]/AT hearing intact  Eyes: Anicteric  Neck: Supple, trachea midline  Lungs:  Scattered rhonchi, normal effort  Heart: S1S2 no rubs  Abdomen:  Soft, nontender, bowel sounds present  Extremities: trace peripheral edema.  Neurologic: Awake, alert, follows commands  Skin: No lesions  Access: R IJ permcath    Basic Metabolic Panel: Recent Labs  Lab 05/18/18 0529 05/19/18 0729 05/22/18 0503  NA 131* 131* 129*  K 4.4 4.3 4.9  CL 94* 92* 92*  CO2 22 24 23   GLUCOSE 96 105* 108*  BUN 44* 65* 75*  CREATININE 6.13* 8.02* 8.51*  CALCIUM 9.1 9.1 9.0  PHOS  --  4.8* 5.7*    Liver Function Tests: Recent Labs  Lab 05/19/18 0729 05/22/18 0503  ALBUMIN 2.0* 2.1*   No results for input(s): LIPASE, AMYLASE in the last 168 hours. No results for input(s): AMMONIA in the last 168 hours.  CBC: Recent Labs  Lab 05/18/18 0529 05/19/18 0729 05/22/18 0503  WBC 11.3* 11.9* 8.6  HGB 7.8* 8.0* 8.8*  HCT 24.9* 25.5* 27.8*  MCV 102.0* 102.4* 100.4*  PLT 210 243 251    Cardiac Enzymes: No results for input(s): CKTOTAL, CKMB, CKMBINDEX, TROPONINI in the last 168 hours.  BNP: Invalid input(s): POCBNP  CBG: No results for input(s): GLUCAP in the last 168 hours.  Microbiology: Results for orders placed or performed during the hospital encounter of 05/03/18  C difficile quick scan w PCR reflex     Status: Abnormal   Collection Time: 05/03/18  7:40 PM  Result Value Ref Range Status   C Diff antigen POSITIVE (A) NEGATIVE Final   C Diff toxin NEGATIVE NEGATIVE Final   C Diff interpretation Results are indeterminate. See PCR results.  Final    Comment: Performed at  [facility], [place] [address] [place], [place] [address]  C. Diff by PCR, Reflexed     Status: Abnormal   Collection Time: 05/03/18  7:40 PM  Result Value Ref Range Status   Toxigenic C. Difficile by PCR POSITIVE (A) NEGATIVE Final    Comment: Positive for toxigenic C. difficile with little to no toxin production. Only treat if clinical presentation suggests symptomatic illness. Performed at [facility], [place] [address], [place], [place] [address]   Expectorated sputum assessment w rflx to resp cult     Status: None   Collection Time: 05/04/18  9:21 AM  Result Value Ref Range Status   Specimen Description EXPECTORATED SPUTUM  Final   Special Requests NONE  Final   Sputum evaluation   Final    THIS SPECIMEN IS ACCEPTABLE FOR SPUTUM CULTURE Performed at [facility], [place] [address], [place], [place] [address]    Report Status 05/04/2018 FINAL  Final  Culture, respiratory     Status: None   Collection Time: 05/04/18  9:21 AM  Result Value Ref Range Status   Specimen Description EXPECTORATED SPUTUM  Final   Special Requests NONE Reflexed from [id number]  Final   Gram Stain   Final    ABUNDANT WBC PRESENT, PREDOMINANTLY PMN RARE SQUAMOUS EPITHELIAL CELLS PRESENT NO ORGANISMS SEEN    Culture   Final    FEW Consistent with  normal respiratory flora. Performed at [facility], [place] [address] [place], [place] [address]    Report Status 05/06/2018 FINAL  Final  Culture, blood (routine x 2)     Status: None   Collection Time: 05/11/18  1:40 PM  Result Value Ref Range Status   Specimen Description BLOOD RIGHT HAND  Final   Special Requests   Final    BOTTLES DRAWN AEROBIC ONLY Blood Culture results may not be optimal due to an inadequate volume of blood received in culture bottles   Culture   Final    NO GROWTH 5 DAYS Performed at [facility], [place] [address] [place], [place] [address]    Report Status 05/16/2018 FINAL  Final  Culture, blood (routine x 2)      Status: None   Collection Time: 05/11/18  1:45 PM  Result Value Ref Range Status   Specimen Description BLOOD RIGHT HAND  Final   Special Requests   Final    BOTTLES DRAWN AEROBIC ONLY Blood Culture results may not be optimal due to an inadequate volume of blood received in culture bottles   Culture   Final    NO GROWTH 5 DAYS Performed at [facility], [place] [address], [place], [place] [address]    Report Status 05/16/2018 FINAL  Final  Culture, respiratory     Status: None   Collection Time: 05/11/18  4:20 PM  Result Value Ref Range Status   Specimen Description SPUTUM  Final   Special Requests NONE  Final   Gram Stain   Final    ABUNDANT WBC PRESENT,BOTH PMN AND MONONUCLEAR NO ORGANISMS SEEN Performed at [facility], [address], [place], [place] [address]    Culture FEW ENTEROCOCCUS FAECALIS  Final   Report Status 05/13/2018 FINAL  Final   Organism ID, Bacteria ENTEROCOCCUS FAECALIS  Final      Susceptibility   Enterococcus faecalis - MIC*    AMPICILLIN <=2 SENSITIVE Sensitive     VANCOMYCIN 2 SENSITIVE Sensitive     GENTAMICIN SYNERGY SENSITIVE Sensitive     * FEW ENTEROCOCCUS FAECALIS  C difficile quick scan w PCR reflex     Status: Abnormal   Collection Time: 05/21/18 11:44 AM  Result Value Ref Range Status   C Diff antigen POSITIVE (A) NEGATIVE Final   C Diff toxin NEGATIVE NEGATIVE Final   C Diff interpretation Results are indeterminate. See PCR results.  Final    Comment: Performed at [facility], [place] [address] [place], [place] [address]  C. Diff by PCR, Reflexed     Status: Abnormal   Collection Time: 05/21/18 11:44 AM  Result Value Ref Range Status   Toxigenic C. Difficile by PCR POSITIVE (A) NEGATIVE Final    Comment: Positive for toxigenic C. difficile with little to no toxin production. Only treat if clinical presentation suggests symptomatic illness. Performed at [facility], [place] [address], [place], [place] [address]      Coagulation Studies: No results for input(s): LABPROT, INR in the last 72 hours.  Urinalysis: No results for input(s): COLORURINE, LABSPEC, PHURINE, GLUCOSEU, HGBUR, BILIRUBINUR, KETONESUR, PROTEINUR, UROBILINOGEN, NITRITE, LEUKOCYTESUR in the last 72 hours.  Invalid input(s): APPERANCEUR    Imaging: No results found.   Medications:       Assessment/ Plan:  73 y.o. male with a PMHx of COPD, end-stage renal disease, hypertension, hepatitis C, cirrhosis, chronic systolic heart failure, depression, anemia of chronic  kidney disease, secondary hyperparathyroidism, seizure disorder, who was admitted to [facility] on 05/03/2018 for ongoing treatment of respiratory failure and end-stage renal disease.   1.  ESRD on HD.    Patient seen and evaluated during hemodialysis.  Tolerating well.  We plan to complete dialysis treatment today and next treatment will be on Friday.  2.  Acute respiratory failure.    Patient breathing comfortably off of the ventilator.  Continue current conservative support.  3.  Anemia of chronic kidney disease.    Continue to periodically monitor hemoglobin.  Most recent hemoglobin was 8.8.  4.  Secondary hyperparathyroidism.    Recommend repeating serum phosphorus today or Friday.   LOS: 0 Jared Hancock 3/25/20209:54 AM

## 2018-05-25 DIAGNOSIS — I5022 Chronic systolic (congestive) heart failure: Secondary | ICD-10-CM | POA: Diagnosis not present

## 2018-05-25 DIAGNOSIS — J181 Lobar pneumonia, unspecified organism: Secondary | ICD-10-CM | POA: Diagnosis not present

## 2018-05-25 DIAGNOSIS — N186 End stage renal disease: Secondary | ICD-10-CM | POA: Diagnosis not present

## 2018-05-25 DIAGNOSIS — J9621 Acute and chronic respiratory failure with hypoxia: Secondary | ICD-10-CM | POA: Diagnosis not present

## 2018-05-25 NOTE — Progress Notes (Addendum)
Pulmonary Critical Care Medicine [place]   PULMONARY CRITICAL CARE SERVICE  PROGRESS NOTE  Date of Service: 05/25/2018  MICAL KICKLIGHTER  YQM:[id number]  DOB: Jul 29, 1945   DOA: 05/03/2018  Referring Physician: Merton Border, MD  HPI: Jared Hancock is a 73 y.o. male seen for follow up of Acute on Chronic Respiratory Failure.  Patient continues to do well on room air.  No longer requiring BiPAP.    Medications: Reviewed on Rounds  Physical Exam:  Vitals: Pulse 57 respirations 13 BP 174/80 O2 sat 95% temp 97.3  Ventilator Settings not currently on ventilator   . General: Comfortable at this time . Eyes: Grossly normal lids, irises & conjunctiva . ENT: grossly tongue is normal . Neck: no obvious mass . Cardiovascular: S1 S2 normal no gallop . Respiratory: No rales or rhonchi noted . Abdomen: soft . Skin: no rash seen on limited exam . Musculoskeletal: not rigid . Psychiatric:unable to assess . Neurologic: no seizure no involuntary movements         Lab Data:   Basic Metabolic Panel: Recent Labs  Lab 05/19/18 0729 05/22/18 0503 05/24/18 1645  NA 131* 129* 134*  K 4.3 4.9 4.0  CL 92* 92* 96*  CO2 24 23 26   GLUCOSE 105* 108* 95  BUN 65* 75* 17  CREATININE 8.02* 8.51* 3.64*  CALCIUM 9.1 9.0 9.3  PHOS 4.8* 5.7* 2.4*    ABG: Recent Labs  Lab 05/21/18 2250  PHART 7.395  PCO2ART 42.3  PO2ART 98.9  HCO3 25.3  O2SAT 97.5    Liver Function Tests: Recent Labs  Lab 05/19/18 0729 05/22/18 0503 05/24/18 1645  ALBUMIN 2.0* 2.1* 2.4*   No results for input(s): LIPASE, AMYLASE in the last 168 hours. No results for input(s): AMMONIA in the last 168 hours.  CBC: Recent Labs  Lab 05/19/18 0729 05/22/18 0503 05/24/18 1635  WBC 11.9* 8.6 6.2  HGB 8.0* 8.8* 8.6*  HCT 25.5* 27.8* 28.1*  MCV 102.4* 100.4* 101.1*  PLT 243 251 240    Cardiac Enzymes: No results for input(s): CKTOTAL, CKMB, CKMBINDEX, TROPONINI in the last 168 hours.  BNP  (last 3 results) No results for input(s): BNP in the last 8760 hours.  ProBNP (last 3 results) No results for input(s): PROBNP in the last 8760 hours.  Radiological Exams: No results found.  Assessment/Plan Active Problems:   Acute on chronic respiratory failure with hypoxia (HCC)   Seizure disorder (HCC)   End stage renal disease on dialysis ([place])   Lobar pneumonia (HCC)   Chronic systolic heart failure ([place])   1. Acute on chronic respiratory failure with hypoxia patient is doing well on room air at this time.  Continue secretion management and pulmonary toilet. 2. Seizure disorder no active is noted at this time 3. End-stage renal failure on hemodialysis continue supportive care 4. Chronic systolic heart failure monitor fluid status continue supportive care   I have personally seen and evaluated the patient, evaluated laboratory and imaging results, formulated the assessment and plan and placed orders. The Patient requires high complexity decision making for assessment and support.  Case was discussed on Rounds with the Respiratory Therapy Staff  Allyne Gee, MD [facility] Pulmonary Critical Care Medicine Sleep Medicine

## 2018-05-25 NOTE — Consult Note (Signed)
Referring Physician: Merton Border, MD  Jared Hancock is an 73 y.o. male.                       Chief Complaint: Sinus bradycardia  HPI: 73 year old male with acute on chronic respiratory failure, COPD, ESRD with hemodialysis, hypertension, Cirrhosis, Hepatitis C, S/P cardiac arrest, Seizure disorder has episodes of bradycardia with HR in 40-50 bpm. Most of the low heart rate is at night  Without drop in systolic BP.   Past Medical History:  Diagnosis Date  . Acute on chronic respiratory failure with hypoxia ([place])   . Chronic systolic heart failure ([place])   . End stage renal disease on dialysis ([place])   . Lobar pneumonia ([place])   . Seizure disorder [facility])       The histories are not reviewed yet. Please review them in the "History" navigator section and refresh this [facility].  Family history: Positive for HTN and alcohol abuse with liver disease.  Social History:  has history on smoking.   Allergies: Ampicillin, shell fish, Tape and Tetracycline  No medications prior to admission.    Results for orders placed or performed during the hospital encounter of 05/03/18 (from the past 48 hour(s))  CBC     Status: Abnormal   Collection Time: 05/24/18  4:35 PM  Result Value Ref Range   WBC 6.2 4.0 - 10.5 K/uL   RBC 2.78 (L) 4.22 - 5.81 MIL/uL   Hemoglobin 8.6 (L) 13.0 - 17.0 g/dL   HCT 28.1 (L) 39.0 - 52.0 %   MCV 101.1 (H) 80.0 - 100.0 fL   MCH 30.9 26.0 - 34.0 pg   MCHC 30.6 30.0 - 36.0 g/dL   RDW 17.9 (H) 11.5 - 15.5 %   Platelets 240 150 - 400 K/uL   nRBC 0.0 0.0 - 0.2 %    Comment: Performed at [facility], [address] [place], [place] [address]  Renal function panel     Status: Abnormal   Collection Time: 05/24/18  4:45 PM  Result Value Ref Range   Sodium 134 (L) 135 - 145 mmol/L   Potassium 4.0 3.5 - 5.1 mmol/L   Chloride 96 (L) 98 - 111 mmol/L   CO2 26 22 - 32 mmol/L   Glucose, Bld 95 70 - 99 mg/dL   BUN 17 8 - 23 mg/dL   Creatinine, Ser 3.64 (H) 0.61 - 1.24  mg/dL   Calcium 9.3 8.9 - 10.3 mg/dL   Phosphorus 2.4 (L) 2.5 - 4.6 mg/dL   Albumin 2.4 (L) 3.5 - 5.0 g/dL   GFR calc non Af Amer 16 (L) >60 mL/min   GFR calc Af Amer 18 (L) >60 mL/min   Anion gap 12 5 - 15    Comment: Performed at [facility] [address] [place], [place] [address]   No results found.  Review Of Systems As per PMH.   P-57, R-14 and BP 170/80, oxygen sat 95 %  General appearance: alert, cooperative, appears stated age and no distress Head: Normocephalic, atraumatic. Eyes: Brown eyes, pale conjunctiva, corneas clear.  Neck: No adenopathy, no carotid bruit, no JVD, supple, symmetrical, trachea midline and thyroid not enlarged. Resp: Clear to auscultation bilaterally. Cardio: Regular rate and rhythm, S1, S2 normal, II/VI systolic murmur, no click, rub or gallop GI: Soft, non-tender; bowel sounds normal; no organomegaly. Extremities: No edema, cyanosis or clubbing. Skin: Warm and dry.  Neurologic: Alert and  oriented X 3, normal strength. Normal coordination and gait.  Assessment/Plan Sinus bradycardia, asymptomatic Non-sustained VT ESRD HTN C. Difficile Colitis COPD Seizure disorder H/O Diastolic heart failure  Continue medical treatment.  Patient has moderate LVH and preserved LV systolic function. Avoid B-blocker and Diltiazem for now. May consider ACE or ARB to improve HTN control and decrease amlodipine dependence if HR drops below 40 bpm. Continue hydralazine.  Birdie Riddle, MD  05/25/2018, 5:39 PM

## 2018-05-26 DIAGNOSIS — J181 Lobar pneumonia, unspecified organism: Secondary | ICD-10-CM | POA: Diagnosis not present

## 2018-05-26 DIAGNOSIS — N186 End stage renal disease: Secondary | ICD-10-CM | POA: Diagnosis not present

## 2018-05-26 DIAGNOSIS — I5022 Chronic systolic (congestive) heart failure: Secondary | ICD-10-CM | POA: Diagnosis not present

## 2018-05-26 DIAGNOSIS — J9621 Acute and chronic respiratory failure with hypoxia: Secondary | ICD-10-CM | POA: Diagnosis not present

## 2018-05-26 LAB — RENAL FUNCTION PANEL
Albumin: 2.4 g/dL — ABNORMAL LOW (ref 3.5–5.0)
Anion gap: 12 (ref 5–15)
BUN: 44 mg/dL — ABNORMAL HIGH (ref 8–23)
CO2: 25 mmol/L (ref 22–32)
Calcium: 9 mg/dL (ref 8.9–10.3)
Chloride: 97 mmol/L — ABNORMAL LOW (ref 98–111)
Creatinine, Ser: 7.61 mg/dL — ABNORMAL HIGH (ref 0.61–1.24)
GFR calc Af Amer: 8 mL/min — ABNORMAL LOW (ref >60)
GFR calc non Af Amer: 6 mL/min — ABNORMAL LOW (ref >60)
Glucose, Bld: 105 mg/dL — ABNORMAL HIGH (ref 70–99)
Phosphorus: 4.1 mg/dL (ref 2.5–4.6)
Potassium: 4.8 mmol/L (ref 3.5–5.1)
SODIUM: 134 mmol/L — AB (ref 135–145)

## 2018-05-26 LAB — CBC
HCT: 26.8 % — ABNORMAL LOW (ref 39.0–52.0)
Hemoglobin: 8.2 g/dL — ABNORMAL LOW (ref 13.0–17.0)
MCH: 31.7 pg (ref 26.0–34.0)
MCHC: 30.6 g/dL (ref 30.0–36.0)
MCV: 103.5 fL — ABNORMAL HIGH (ref 80.0–100.0)
PLATELETS: 230 10*3/uL (ref 150–400)
RBC: 2.59 MIL/uL — ABNORMAL LOW (ref 4.22–5.81)
RDW: 18.3 % — ABNORMAL HIGH (ref 11.5–15.5)
WBC: 6.1 10*3/uL (ref 4.0–10.5)
nRBC: 0 % (ref 0.0–0.2)

## 2018-05-26 NOTE — Progress Notes (Addendum)
Pulmonary Critical Care Medicine [place]   PULMONARY CRITICAL CARE SERVICE  PROGRESS NOTE  Date of Service: 05/26/2018  Jared Hancock  OIN:[id number]  DOB: 03/31/1945   DOA: 05/03/2018  Referring Physician: Merton Border, MD  HPI: Jared Hancock is a 72 y.o. male seen for follow up of Acute on Chronic Respiratory Failure.  Patient remains on room air at this time with no acute distress noted.  Medications: Reviewed on Rounds  Physical Exam:  Vitals: Pulse 66 respirations 14 BP 163/84 O2 sat 96% temp 96.2  Ventilator Settings not currently on ventilator  . General: Comfortable at this time . Eyes: Grossly normal lids, irises & conjunctiva . ENT: grossly tongue is normal . Neck: no obvious mass . Cardiovascular: S1 S2 normal no gallop . Respiratory: No rales or rhonchi noted . Abdomen: soft . Skin: no rash seen on limited exam . Musculoskeletal: not rigid . Psychiatric:unable to assess . Neurologic: no seizure no involuntary movements         Lab Data:   Basic Metabolic Panel: Recent Labs  Lab 05/22/18 0503 05/24/18 1645  NA 129* 134*  K 4.9 4.0  CL 92* 96*  CO2 23 26  GLUCOSE 108* 95  BUN 75* 17  CREATININE 8.51* 3.64*  CALCIUM 9.0 9.3  PHOS 5.7* 2.4*    ABG: Recent Labs  Lab 05/21/18 2250  PHART 7.395  PCO2ART 42.3  PO2ART 98.9  HCO3 25.3  O2SAT 97.5    Liver Function Tests: Recent Labs  Lab 05/22/18 0503 05/24/18 1645  ALBUMIN 2.1* 2.4*   No results for input(s): LIPASE, AMYLASE in the last 168 hours. No results for input(s): AMMONIA in the last 168 hours.  CBC: Recent Labs  Lab 05/22/18 0503 05/24/18 1635  WBC 8.6 6.2  HGB 8.8* 8.6*  HCT 27.8* 28.1*  MCV 100.4* 101.1*  PLT 251 240    Cardiac Enzymes: No results for input(s): CKTOTAL, CKMB, CKMBINDEX, TROPONINI in the last 168 hours.  BNP (last 3 results) No results for input(s): BNP in the last 8760 hours.  ProBNP (last 3 results) No results for input(s):  PROBNP in the last 8760 hours.  Radiological Exams: No results found.  Assessment/Plan Active Problems:   Acute on chronic respiratory failure with hypoxia (HCC)   Seizure disorder (HCC)   End stage renal disease on dialysis ([place])   Lobar pneumonia (HCC)   Chronic systolic heart failure ([place])   1. Acute on chronic respiratory failure with hypoxia patient is doing well on room air at this time.  Continue secretion management and pulmonary toilet 2. Seizure disorder no active seizures noted at this time 3. End-stage renal failure on hemodialysis continue supportive care 4. Chronic systolic heart failure monitor fluid status and continue supportive care   I have personally seen and evaluated the patient, evaluated laboratory and imaging results, formulated the assessment and plan and placed orders. The Patient requires high complexity decision making for assessment and support.  Case was discussed on Rounds with the Respiratory Therapy Staff  Allyne Gee, MD [facility] Pulmonary Critical Care Medicine Sleep Medicine

## 2018-05-26 NOTE — Progress Notes (Signed)
[facility]  ROUNDING NOTE   Subjective:  Patient was a bit agitated overnight. This a.m. awake and alert but confused. Due for dialysis today.  Objective:  Vital signs in last 24 hours:  Temperature 96.8 pulse 66 respirations 14 blood pressure 163/84  Physical Exam: General: No acute distress  Head: [place]/AT hearing intact  Eyes: Anicteric  Neck: Supple, trachea midline  Lungs:  Scattered rhonchi, normal effort  Heart: S1S2 no rubs  Abdomen:  Soft, nontender, bowel sounds present  Extremities: trace peripheral edema.  Right arm swelling.  Neurologic: Awake, alert, follows commands  Skin: No lesions  Access: R IJ permcath    Basic Metabolic Panel: Recent Labs  Lab 05/22/18 0503 05/24/18 1645  NA 129* 134*  K 4.9 4.0  CL 92* 96*  CO2 23 26  GLUCOSE 108* 95  BUN 75* 17  CREATININE 8.51* 3.64*  CALCIUM 9.0 9.3  PHOS 5.7* 2.4*    Liver Function Tests: Recent Labs  Lab 05/22/18 0503 05/24/18 1645  ALBUMIN 2.1* 2.4*   No results for input(s): LIPASE, AMYLASE in the last 168 hours. No results for input(s): AMMONIA in the last 168 hours.  CBC: Recent Labs  Lab 05/22/18 0503 05/24/18 1635  WBC 8.6 6.2  HGB 8.8* 8.6*  HCT 27.8* 28.1*  MCV 100.4* 101.1*  PLT 251 240    Cardiac Enzymes: No results for input(s): CKTOTAL, CKMB, CKMBINDEX, TROPONINI in the last 168 hours.  BNP: Invalid input(s): POCBNP  CBG: No results for input(s): GLUCAP in the last 168 hours.  Microbiology: Results for orders placed or performed during the hospital encounter of 05/03/18  C difficile quick scan w PCR reflex     Status: Abnormal   Collection Time: 05/03/18  7:40 PM  Result Value Ref Range Status   C Diff antigen POSITIVE (A) NEGATIVE Final   C Diff toxin NEGATIVE NEGATIVE Final   C Diff interpretation Results are indeterminate. See PCR results.  Final    Comment: Performed at [facility], [place] [address] [place], [place] [address]  C. Diff by PCR,  Reflexed     Status: Abnormal   Collection Time: 05/03/18  7:40 PM  Result Value Ref Range Status   Toxigenic C. Difficile by PCR POSITIVE (A) NEGATIVE Final    Comment: Positive for toxigenic C. difficile with little to no toxin production. Only treat if clinical presentation suggests symptomatic illness. Performed at [facility], [place] [address], [place], [place] [address]   Expectorated sputum assessment w rflx to resp cult     Status: None   Collection Time: 05/04/18  9:21 AM  Result Value Ref Range Status   Specimen Description EXPECTORATED SPUTUM  Final   Special Requests NONE  Final   Sputum evaluation   Final    THIS SPECIMEN IS ACCEPTABLE FOR SPUTUM CULTURE Performed at [facility], [place] [address] [place], [place] [address]    Report Status 05/04/2018 FINAL  Final  Culture, respiratory     Status: None   Collection Time: 05/04/18  9:21 AM  Result Value Ref Range Status   Specimen Description EXPECTORATED SPUTUM  Final   Special Requests NONE Reflexed from [id number]  Final   Gram Stain   Final    ABUNDANT WBC PRESENT, PREDOMINANTLY PMN RARE SQUAMOUS EPITHELIAL CELLS PRESENT NO ORGANISMS SEEN    Culture   Final    FEW Consistent with normal respiratory flora. Performed at [facility], [address] [place], [place] [address]    Report Status 05/06/2018 FINAL  Final  Culture, blood (routine x 2)     Status: None   Collection Time: 05/11/18  1:40 PM  Result Value Ref Range Status   Specimen Description BLOOD RIGHT HAND  Final   Special Requests   Final    BOTTLES DRAWN AEROBIC ONLY Blood Culture results may not be optimal due to an inadequate volume of blood received in culture bottles   Culture   Final    NO GROWTH 5 DAYS Performed at [facility], [place] [address], [place], [place] [address]    Report Status 05/16/2018 FINAL  Final  Culture, blood (routine x 2)     Status: None   Collection Time: 05/11/18  1:45 PM  Result Value Ref Range  Status   Specimen Description BLOOD RIGHT HAND  Final   Special Requests   Final    BOTTLES DRAWN AEROBIC ONLY Blood Culture results may not be optimal due to an inadequate volume of blood received in culture bottles   Culture   Final    NO GROWTH 5 DAYS Performed at [facility], [place] [address] [place], [place] [address]    Report Status 05/16/2018 FINAL  Final  Culture, respiratory     Status: None   Collection Time: 05/11/18  4:20 PM  Result Value Ref Range Status   Specimen Description SPUTUM  Final   Special Requests NONE  Final   Gram Stain   Final    ABUNDANT WBC PRESENT,BOTH PMN AND MONONUCLEAR NO ORGANISMS SEEN Performed at [facility], [address] [place], [place] [address]    Culture FEW ENTEROCOCCUS FAECALIS  Final   Report Status 05/13/2018 FINAL  Final   Organism ID, Bacteria ENTEROCOCCUS FAECALIS  Final      Susceptibility   Enterococcus faecalis - MIC*    AMPICILLIN <=2 SENSITIVE Sensitive     VANCOMYCIN 2 SENSITIVE Sensitive     GENTAMICIN SYNERGY SENSITIVE Sensitive     * FEW ENTEROCOCCUS FAECALIS  C difficile quick scan w PCR reflex     Status: Abnormal   Collection Time: 05/21/18 11:44 AM  Result Value Ref Range Status   C Diff antigen POSITIVE (A) NEGATIVE Final   C Diff toxin NEGATIVE NEGATIVE Final   C Diff interpretation Results are indeterminate. See PCR results.  Final    Comment: Performed at [facility], [place] [address] [place], [place] [address]  C. Diff by PCR, Reflexed     Status: Abnormal   Collection Time: 05/21/18 11:44 AM  Result Value Ref Range Status   Toxigenic C. Difficile by PCR POSITIVE (A) NEGATIVE Final    Comment: Positive for toxigenic C. difficile with little to no toxin production. Only treat if clinical presentation suggests symptomatic illness. Performed at [facility], [place] [address] [place], [place] [address]     Coagulation Studies: No results for input(s): LABPROT, INR in the last 72  hours.  Urinalysis: No results for input(s): COLORURINE, LABSPEC, PHURINE, GLUCOSEU, HGBUR, BILIRUBINUR, KETONESUR, PROTEINUR, UROBILINOGEN, NITRITE, LEUKOCYTESUR in the last 72 hours.  Invalid input(s): APPERANCEUR    Imaging: No results found.   Medications:       Assessment/ Plan:  72 y.o. male with a PMHx of COPD, end-stage renal disease, hypertension, hepatitis C, cirrhosis, chronic systolic heart failure, depression, anemia of chronic kidney disease, secondary hyperparathyroidism, seizure disorder, who was admitted to  [facility] on 05/03/2018 for ongoing treatment of respiratory failure and end-stage renal disease.   1.  ESRD on HD.    Patient seen at bedside.  Due for dialysis later today.  Orders have been prepared.  2.  Acute respiratory failure.    Remains off the ventilator, breathing comfortably.  3.  Anemia of chronic kidney disease.    Hemoglobin currently 8.6.  Continue to monitor CBC and repeat on Monday.  4.  Secondary hyperparathyroidism.    Most recent serum phosphorus was 2.4.  Recommend rechecking serum phosphorus today.  Further plan as patient progresses.   LOS: 0 Robben Jagiello 3/27/20208:28 AM

## 2018-05-27 DIAGNOSIS — I5022 Chronic systolic (congestive) heart failure: Secondary | ICD-10-CM | POA: Diagnosis not present

## 2018-05-27 DIAGNOSIS — J181 Lobar pneumonia, unspecified organism: Secondary | ICD-10-CM | POA: Diagnosis not present

## 2018-05-27 DIAGNOSIS — N186 End stage renal disease: Secondary | ICD-10-CM | POA: Diagnosis not present

## 2018-05-27 DIAGNOSIS — J9621 Acute and chronic respiratory failure with hypoxia: Secondary | ICD-10-CM | POA: Diagnosis not present

## 2018-05-27 NOTE — Progress Notes (Addendum)
Pulmonary Critical Care Medicine [place]   PULMONARY CRITICAL CARE SERVICE  PROGRESS NOTE  Date of Service: 05/27/2018  Jared Hancock  SAY:[id number]  DOB: Mar 28, 1945   DOA: 05/03/2018  Referring Physician: Merton Border, MD  HPI: Jared Hancock is a 73 y.o. male seen for follow up of Acute on Chronic Respiratory Failure.  No acute distress noted and patient this time remains on room air with no difficulty.  Medications: Reviewed on Rounds  Physical Exam:  Vitals: Pulse 66 respirations 19 BP 185/69 O2 sat 94% 98.2  Ventilator Settings not currently on ventilator  . General: Comfortable at this time . Eyes: Grossly normal lids, irises & conjunctiva . ENT: grossly tongue is normal . Neck: no obvious mass . Cardiovascular: S1 S2 normal no gallop . Respiratory: No rales or rhonchi noted . Abdomen: soft . Skin: no rash seen on limited exam . Musculoskeletal: not rigid . Psychiatric:unable to assess . Neurologic: no seizure no involuntary movements         Lab Data:   Basic Metabolic Panel: Recent Labs  Lab 05/22/18 0503 05/24/18 1645 05/26/18 1540  NA 129* 134* 134*  K 4.9 4.0 4.8  CL 92* 96* 97*  CO2 23 26 25   GLUCOSE 108* 95 105*  BUN 75* 17 44*  CREATININE 8.51* 3.64* 7.61*  CALCIUM 9.0 9.3 9.0  PHOS 5.7* 2.4* 4.1    ABG: Recent Labs  Lab 05/21/18 2250  PHART 7.395  PCO2ART 42.3  PO2ART 98.9  HCO3 25.3  O2SAT 97.5    Liver Function Tests: Recent Labs  Lab 05/22/18 0503 05/24/18 1645 05/26/18 1540  ALBUMIN 2.1* 2.4* 2.4*   No results for input(s): LIPASE, AMYLASE in the last 168 hours. No results for input(s): AMMONIA in the last 168 hours.  CBC: Recent Labs  Lab 05/22/18 0503 05/24/18 1635 05/26/18 1540  WBC 8.6 6.2 6.1  HGB 8.8* 8.6* 8.2*  HCT 27.8* 28.1* 26.8*  MCV 100.4* 101.1* 103.5*  PLT 251 240 230    Cardiac Enzymes: No results for input(s): CKTOTAL, CKMB, CKMBINDEX, TROPONINI in the last 168 hours.  BNP  (last 3 results) No results for input(s): BNP in the last 8760 hours.  ProBNP (last 3 results) No results for input(s): PROBNP in the last 8760 hours.  Radiological Exams: No results found.  Assessment/Plan Active Problems:   Acute on chronic respiratory failure with hypoxia (HCC)   Seizure disorder (HCC)   End stage renal disease on dialysis ([place])   Lobar pneumonia (HCC)   Chronic systolic heart failure ([place])   1. Acute chronic respiratory failure with hypoxia patient is doing well on room air at this time continue current management. 2. Seizure disorder no active seizures noted at this time 3. End-stage renal disease on hemodialysis continue supportive care 4. Chronic systolic heart failure continue to monitor fluid status and supportive care.   I have personally seen and evaluated the patient, evaluated laboratory and imaging results, formulated the assessment and plan and placed orders. The Patient requires high complexity decision making for assessment and support.  Case was discussed on Rounds with the Respiratory Therapy Staff  Allyne Gee, MD [facility] Pulmonary Critical Care Medicine Sleep Medicine

## 2018-05-28 DIAGNOSIS — J9621 Acute and chronic respiratory failure with hypoxia: Secondary | ICD-10-CM | POA: Diagnosis not present

## 2018-05-28 DIAGNOSIS — N186 End stage renal disease: Secondary | ICD-10-CM | POA: Diagnosis not present

## 2018-05-28 DIAGNOSIS — I5022 Chronic systolic (congestive) heart failure: Secondary | ICD-10-CM | POA: Diagnosis not present

## 2018-05-28 DIAGNOSIS — J181 Lobar pneumonia, unspecified organism: Secondary | ICD-10-CM | POA: Diagnosis not present

## 2018-05-28 NOTE — Progress Notes (Addendum)
Pulmonary Critical Care Medicine [place]   PULMONARY CRITICAL CARE SERVICE  PROGRESS NOTE  Date of Service: 05/28/2018  JULIE PAOLINI  DXI:[id number]  DOB: [birth year]   DOA: 05/03/2018  Referring Physician: Merton Border, MD  HPI: Jared Hancock is a 73 y.o. male seen for follow up of Acute on Chronic Respiratory Failure.  Patient continues to do well on room air.  No distress is noted at this time.  Medications: Reviewed on Rounds  Physical Exam:  Vitals: Pulse 76 respirations 12 BP 118/51 O2 sat 98% temp 97.0  Ventilator Settings not currently on ventilator  . General: Comfortable at this time . Eyes: Grossly normal lids, irises & conjunctiva . ENT: grossly tongue is normal . Neck: no obvious mass . Cardiovascular: S1 S2 normal no gallop . Respiratory: No rales or rhonchi noted . Abdomen: soft . Skin: no rash seen on limited exam . Musculoskeletal: not rigid . Psychiatric:unable to assess . Neurologic: no seizure no involuntary movements         Lab Data:   Basic Metabolic Panel: Recent Labs  Lab 05/22/18 0503 05/24/18 1645 05/26/18 1540  NA 129* 134* 134*  K 4.9 4.0 4.8  CL 92* 96* 97*  CO2 23 26 25   GLUCOSE 108* 95 105*  BUN 75* 17 44*  CREATININE 8.51* 3.64* 7.61*  CALCIUM 9.0 9.3 9.0  PHOS 5.7* 2.4* 4.1    ABG: Recent Labs  Lab 05/21/18 2250  PHART 7.395  PCO2ART 42.3  PO2ART 98.9  HCO3 25.3  O2SAT 97.5    Liver Function Tests: Recent Labs  Lab 05/22/18 0503 05/24/18 1645 05/26/18 1540  ALBUMIN 2.1* 2.4* 2.4*   No results for input(s): LIPASE, AMYLASE in the last 168 hours. No results for input(s): AMMONIA in the last 168 hours.  CBC: Recent Labs  Lab 05/22/18 0503 05/24/18 1635 05/26/18 1540  WBC 8.6 6.2 6.1  HGB 8.8* 8.6* 8.2*  HCT 27.8* 28.1* 26.8*  MCV 100.4* 101.1* 103.5*  PLT 251 240 230    Cardiac Enzymes: No results for input(s): CKTOTAL, CKMB, CKMBINDEX, TROPONINI in the last 168 hours.  BNP  (last 3 results) No results for input(s): BNP in the last 8760 hours.  ProBNP (last 3 results) No results for input(s): PROBNP in the last 8760 hours.  Radiological Exams: No results found.  Assessment/Plan Active Problems:   Acute on chronic respiratory failure with hypoxia (HCC)   Seizure disorder (HCC)   End stage renal disease on dialysis ([place])   Lobar pneumonia (HCC)   Chronic systolic heart failure ([place])   1. Acute on chronic respiratory failure with hypoxia patient is doing well and has been on room air for multiple days now without difficulty.  At this point pulmonology will be signing off on patient.  Please do not hesitate to call us if there is a change in patient's status. 2. Seizure disorder no active seizures noted at this time 3. End-stage renal disease on hemodialysis continue supportive care 4. Chronic systolic heart failure continue to monitor fluid status and supportive care   I have personally seen and evaluated the patient, evaluated laboratory and imaging results, formulated the assessment and plan and placed orders. The Patient requires high complexity decision making for assessment and support.  Case was discussed on Rounds with the Respiratory Therapy Staff  Allyne Gee, MD [facility] Pulmonary Critical Care Medicine Sleep Medicine

## 2018-05-29 LAB — RENAL FUNCTION PANEL
ALBUMIN: 2.6 g/dL — AB (ref 3.5–5.0)
Anion gap: 16 — ABNORMAL HIGH (ref 5–15)
BUN: 43 mg/dL — ABNORMAL HIGH (ref 8–23)
CALCIUM: 9.2 mg/dL (ref 8.9–10.3)
CO2: 24 mmol/L (ref 22–32)
Chloride: 97 mmol/L — ABNORMAL LOW (ref 98–111)
Creatinine, Ser: 8.58 mg/dL — ABNORMAL HIGH (ref 0.61–1.24)
GFR calc Af Amer: 6 mL/min — ABNORMAL LOW (ref >60)
GFR calc non Af Amer: 6 mL/min — ABNORMAL LOW (ref >60)
Glucose, Bld: 89 mg/dL (ref 70–99)
Phosphorus: 4.4 mg/dL (ref 2.5–4.6)
Potassium: 5.5 mmol/L — ABNORMAL HIGH (ref 3.5–5.1)
SODIUM: 137 mmol/L (ref 135–145)

## 2018-05-29 LAB — CBC
HCT: 30.7 % — ABNORMAL LOW (ref 39.0–52.0)
Hemoglobin: 9.1 g/dL — ABNORMAL LOW (ref 13.0–17.0)
MCH: 30.7 pg (ref 26.0–34.0)
MCHC: 29.6 g/dL — ABNORMAL LOW (ref 30.0–36.0)
MCV: 103.7 fL — ABNORMAL HIGH (ref 80.0–100.0)
Platelets: 213 10*3/uL (ref 150–400)
RBC: 2.96 MIL/uL — ABNORMAL LOW (ref 4.22–5.81)
RDW: 18.3 % — ABNORMAL HIGH (ref 11.5–15.5)
WBC: 6 10*3/uL (ref 4.0–10.5)
nRBC: 0 % (ref 0.0–0.2)

## 2018-05-29 NOTE — Progress Notes (Signed)
[facility]  ROUNDING NOTE   Subjective:  Patient seen and evaluated during hemodialysis. Tolerating well.   Objective:  Vital signs in last 24 hours:  Temperature 97.8 pulse 97 respirations 21 blood pressure 113/53  Physical Exam: General: No acute distress  Head: [place]/AT hearing intact  Eyes: Anicteric  Neck: Supple, trachea midline  Lungs:  Scattered rhonchi, normal effort  Heart: S1S2 no rubs  Abdomen:  Soft, nontender, bowel sounds present  Extremities: trace peripheral edema.  Right arm swelling.  Neurologic: Awake, alert, follows commands  Skin: No lesions  Access: R IJ permcath    Basic Metabolic Panel: Recent Labs  Lab 05/24/18 1645 05/26/18 1540  NA 134* 134*  K 4.0 4.8  CL 96* 97*  CO2 26 25  GLUCOSE 95 105*  BUN 17 44*  CREATININE 3.64* 7.61*  CALCIUM 9.3 9.0  PHOS 2.4* 4.1    Liver Function Tests: Recent Labs  Lab 05/24/18 1645 05/26/18 1540  ALBUMIN 2.4* 2.4*   No results for input(s): LIPASE, AMYLASE in the last 168 hours. No results for input(s): AMMONIA in the last 168 hours.  CBC: Recent Labs  Lab 05/24/18 1635 05/26/18 1540 05/29/18 0710  WBC 6.2 6.1 6.0  HGB 8.6* 8.2* 9.1*  HCT 28.1* 26.8* 30.7*  MCV 101.1* 103.5* 103.7*  PLT 240 230 213    Cardiac Enzymes: No results for input(s): CKTOTAL, CKMB, CKMBINDEX, TROPONINI in the last 168 hours.  BNP: Invalid input(s): POCBNP  CBG: No results for input(s): GLUCAP in the last 168 hours.  Microbiology: Results for orders placed or performed during the hospital encounter of 05/03/18  C difficile quick scan w PCR reflex     Status: Abnormal   Collection Time: 05/03/18  7:40 PM  Result Value Ref Range Status   C Diff antigen POSITIVE (A) NEGATIVE Final   C Diff toxin NEGATIVE NEGATIVE Final   C Diff interpretation Results are indeterminate. See PCR results.  Final    Comment: Performed at [facility], [place] [address] [place], [place] [address]  C. Diff by  PCR, Reflexed     Status: Abnormal   Collection Time: 05/03/18  7:40 PM  Result Value Ref Range Status   Toxigenic C. Difficile by PCR POSITIVE (A) NEGATIVE Final    Comment: Positive for toxigenic C. difficile with little to no toxin production. Only treat if clinical presentation suggests symptomatic illness. Performed at [facility], [place] [address], [place], [place] [address]   Expectorated sputum assessment w rflx to resp cult     Status: None   Collection Time: 05/04/18  9:21 AM  Result Value Ref Range Status   Specimen Description EXPECTORATED SPUTUM  Final   Special Requests NONE  Final   Sputum evaluation   Final    THIS SPECIMEN IS ACCEPTABLE FOR SPUTUM CULTURE Performed at [facility], [place] [address], [place], [place] [address]    Report Status 05/04/2018 FINAL  Final  Culture, respiratory     Status: None   Collection Time: 05/04/18  9:21 AM  Result Value Ref Range Status   Specimen Description EXPECTORATED SPUTUM  Final   Special Requests NONE Reflexed from [id number]  Final   Gram Stain   Final    ABUNDANT WBC PRESENT, PREDOMINANTLY PMN RARE SQUAMOUS EPITHELIAL CELLS PRESENT NO ORGANISMS SEEN    Culture   Final    FEW Consistent with normal respiratory flora. Performed at [facility], [place]  [address], [place], [place] [address]    Report Status 05/06/2018 FINAL  Final  Culture, blood (routine x 2)     Status: None   Collection Time: 05/11/18  1:40 PM  Result Value Ref Range Status   Specimen Description BLOOD RIGHT HAND  Final   Special Requests   Final    BOTTLES DRAWN AEROBIC ONLY Blood Culture results may not be optimal due to an inadequate volume of blood received in culture bottles   Culture   Final    NO GROWTH 5 DAYS Performed at [facility], [place] [address], [place], [place] [address]    Report Status 05/16/2018 FINAL  Final  Culture, blood (routine x 2)     Status: None   Collection Time: 05/11/18  1:45 PM  Result Value Ref  Range Status   Specimen Description BLOOD RIGHT HAND  Final   Special Requests   Final    BOTTLES DRAWN AEROBIC ONLY Blood Culture results may not be optimal due to an inadequate volume of blood received in culture bottles   Culture   Final    NO GROWTH 5 DAYS Performed at [facility], [place] [address] [place], [place] [address]    Report Status 05/16/2018 FINAL  Final  Culture, respiratory     Status: None   Collection Time: 05/11/18  4:20 PM  Result Value Ref Range Status   Specimen Description SPUTUM  Final   Special Requests NONE  Final   Gram Stain   Final    ABUNDANT WBC PRESENT,BOTH PMN AND MONONUCLEAR NO ORGANISMS SEEN Performed at [facility], [address], [place], [place] [address]    Culture FEW ENTEROCOCCUS FAECALIS  Final   Report Status 05/13/2018 FINAL  Final   Organism ID, Bacteria ENTEROCOCCUS FAECALIS  Final      Susceptibility   Enterococcus faecalis - MIC*    AMPICILLIN <=2 SENSITIVE Sensitive     VANCOMYCIN 2 SENSITIVE Sensitive     GENTAMICIN SYNERGY SENSITIVE Sensitive     * FEW ENTEROCOCCUS FAECALIS  C difficile quick scan w PCR reflex     Status: Abnormal   Collection Time: 05/21/18 11:44 AM  Result Value Ref Range Status   C Diff antigen POSITIVE (A) NEGATIVE Final   C Diff toxin NEGATIVE NEGATIVE Final   C Diff interpretation Results are indeterminate. See PCR results.  Final    Comment: Performed at [facility], [place] [address] [place], [place] [address]  C. Diff by PCR, Reflexed     Status: Abnormal   Collection Time: 05/21/18 11:44 AM  Result Value Ref Range Status   Toxigenic C. Difficile by PCR POSITIVE (A) NEGATIVE Final    Comment: Positive for toxigenic C. difficile with little to no toxin production. Only treat if clinical presentation suggests symptomatic illness. Performed at [facility], [place] [address], [place],  [address]     Coagulation Studies: No results for input(s): LABPROT, INR in the last  72 hours.  Urinalysis: No results for input(s): COLORURINE, LABSPEC, PHURINE, GLUCOSEU, HGBUR, BILIRUBINUR, KETONESUR, PROTEINUR, UROBILINOGEN, NITRITE, LEUKOCYTESUR in the last 72 hours.  Invalid input(s): APPERANCEUR    Imaging: No results found.   Medications:       Assessment/ Plan:  73 y.o. male with a PMHx of COPD, end-stage renal disease, hypertension, hepatitis C, cirrhosis, chronic systolic heart failure, depression, anemia of chronic kidney disease, secondary hyperparathyroidism, seizure disorder, who was admitted to [facility]  on 05/03/2018 for ongoing treatment of respiratory failure and end-stage renal disease.   1.  ESRD on HD.    Patient seen and evaluated during hemodialysis.  Tolerating well.  We plan to complete dialysis today and next dialysis treatment will be on Wednesday.  2.  Acute respiratory failure.    Patient continues to do well off of the ventilator.  Breathing comfortably at the moment.  3.  Anemia of chronic kidney disease.    Hemoglobin is improved to 9.1.  Continue to monitor CBC trend.  4.  Secondary hyperparathyroidism.    Phosphorus within acceptable range currently at 4.1.  Continue to monitor serum phosphorus and calcium.   LOS: 0 Jared Hancock 3/30/20208:22 AM

## 2018-05-31 LAB — RENAL FUNCTION PANEL
Albumin: 2.7 g/dL — ABNORMAL LOW (ref 3.5–5.0)
Anion gap: 13 (ref 5–15)
BUN: 37 mg/dL — AB (ref 8–23)
CO2: 25 mmol/L (ref 22–32)
Calcium: 9.5 mg/dL (ref 8.9–10.3)
Chloride: 97 mmol/L — ABNORMAL LOW (ref 98–111)
Creatinine, Ser: 7.61 mg/dL — ABNORMAL HIGH (ref 0.61–1.24)
GFR calc Af Amer: 8 mL/min — ABNORMAL LOW (ref >60)
GFR calc non Af Amer: 6 mL/min — ABNORMAL LOW (ref >60)
Glucose, Bld: 86 mg/dL (ref 70–99)
Phosphorus: 5.2 mg/dL — ABNORMAL HIGH (ref 2.5–4.6)
Potassium: 5.5 mmol/L — ABNORMAL HIGH (ref 3.5–5.1)
Sodium: 135 mmol/L (ref 135–145)

## 2018-05-31 LAB — CBC
HCT: 32.7 % — ABNORMAL LOW (ref 39.0–52.0)
Hemoglobin: 10.2 g/dL — ABNORMAL LOW (ref 13.0–17.0)
MCH: 32.2 pg (ref 26.0–34.0)
MCHC: 31.2 g/dL (ref 30.0–36.0)
MCV: 103.2 fL — ABNORMAL HIGH (ref 80.0–100.0)
Platelets: 160 10*3/uL (ref 150–400)
RBC: 3.17 MIL/uL — ABNORMAL LOW (ref 4.22–5.81)
RDW: 18.5 % — ABNORMAL HIGH (ref 11.5–15.5)
WBC: 5.3 10*3/uL (ref 4.0–10.5)
nRBC: 0 % (ref 0.0–0.2)

## 2018-05-31 NOTE — Progress Notes (Signed)
[facility]  ROUNDING NOTE   Subjective:  Patient seen and evaluated during hemodialysis. Tolerating well. Blood flow rate 400.  Objective:  Vital signs in last 24 hours:  Temperature 97.4 pulse 97 respirations 15 blood pressure 131/59  Physical Exam: General: No acute distress  Head: [place]/AT hearing intact  Eyes: Anicteric  Neck: Supple, trachea midline  Lungs:  Scattered rhonchi, normal effort  Heart: S1S2 no rubs  Abdomen:  Soft, nontender, bowel sounds present  Extremities: trace peripheral edema.  Right arm swelling.  Neurologic: Awake, alert, follows commands  Skin: No lesions  Access: R IJ permcath    Basic Metabolic Panel: Recent Labs  Lab 05/24/18 1645 05/26/18 1540 05/29/18 0710 05/31/18 0619  NA 134* 134* 137 135  K 4.0 4.8 5.5* 5.5*  CL 96* 97* 97* 97*  CO2 26 25 24 25   GLUCOSE 95 105* 89 86  BUN 17 44* 43* 37*  CREATININE 3.64* 7.61* 8.58* 7.61*  CALCIUM 9.3 9.0 9.2 9.5  PHOS 2.4* 4.1 4.4 5.2*    Liver Function Tests: Recent Labs  Lab 05/24/18 1645 05/26/18 1540 05/29/18 0710 05/31/18 0619  ALBUMIN 2.4* 2.4* 2.6* 2.7*   No results for input(s): LIPASE, AMYLASE in the last 168 hours. No results for input(s): AMMONIA in the last 168 hours.  CBC: Recent Labs  Lab 05/24/18 1635 05/26/18 1540 05/29/18 0710 05/31/18 0619  WBC 6.2 6.1 6.0 5.3  HGB 8.6* 8.2* 9.1* 10.2*  HCT 28.1* 26.8* 30.7* 32.7*  MCV 101.1* 103.5* 103.7* 103.2*  PLT 240 230 213 160    Cardiac Enzymes: No results for input(s): CKTOTAL, CKMB, CKMBINDEX, TROPONINI in the last 168 hours.  BNP: Invalid input(s): POCBNP  CBG: No results for input(s): GLUCAP in the last 168 hours.  Microbiology: Results for orders placed or performed during the hospital encounter of 05/03/18  C difficile quick scan w PCR reflex     Status: Abnormal   Collection Time: 05/03/18  7:40 PM  Result Value Ref Range Status   C Diff antigen POSITIVE (A) NEGATIVE Final   C Diff  toxin NEGATIVE NEGATIVE Final   C Diff interpretation Results are indeterminate. See PCR results.  Final    Comment: Performed at [facility], [place] [address] [place], [place] [address]  C. Diff by PCR, Reflexed     Status: Abnormal   Collection Time: 05/03/18  7:40 PM  Result Value Ref Range Status   Toxigenic C. Difficile by PCR POSITIVE (A) NEGATIVE Final    Comment: Positive for toxigenic C. difficile with little to no toxin production. Only treat if clinical presentation suggests symptomatic illness. Performed at [facility], [place] [address], [place], [place] [address]   Expectorated sputum assessment w rflx to resp cult     Status: None   Collection Time: 05/04/18  9:21 AM  Result Value Ref Range Status   Specimen Description EXPECTORATED SPUTUM  Final   Special Requests NONE  Final   Sputum evaluation   Final    THIS SPECIMEN IS ACCEPTABLE FOR SPUTUM CULTURE Performed at [facility], [place] [address], [place],  [address]    Report Status 05/04/2018 FINAL  Final  Culture, respiratory     Status: None   Collection Time: 05/04/18  9:21 AM  Result Value Ref Range Status   Specimen Description EXPECTORATED SPUTUM  Final   Special Requests NONE Reflexed from [id number]  Final   Gram Stain   Final  ABUNDANT WBC PRESENT, PREDOMINANTLY PMN RARE SQUAMOUS EPITHELIAL CELLS PRESENT NO ORGANISMS SEEN    Culture   Final    FEW Consistent with normal respiratory flora. Performed at [facility], [place] [address] [place], [place] [address]    Report Status 05/06/2018 FINAL  Final  Culture, blood (routine x 2)     Status: None   Collection Time: 05/11/18  1:40 PM  Result Value Ref Range Status   Specimen Description BLOOD RIGHT HAND  Final   Special Requests   Final    BOTTLES DRAWN AEROBIC ONLY Blood Culture results may not be optimal due to an inadequate volume of blood received in culture bottles   Culture   Final    NO GROWTH 5 DAYS Performed at [facility], [place] [address] [place], [place] [address]    Report Status 05/16/2018 FINAL  Final  Culture, blood (routine x 2)     Status: None   Collection Time: 05/11/18  1:45 PM  Result Value Ref Range Status   Specimen Description BLOOD RIGHT HAND  Final   Special Requests   Final    BOTTLES DRAWN AEROBIC ONLY Blood Culture results may not be optimal due to an inadequate volume of blood received in culture bottles   Culture   Final    NO GROWTH 5 DAYS Performed at [facility], [place] [address], [place], [place] [address]    Report Status 05/16/2018 FINAL  Final  Culture, respiratory     Status: None   Collection Time: 05/11/18  4:20 PM  Result Value Ref Range Status   Specimen Description SPUTUM  Final   Special Requests NONE  Final   Gram Stain   Final    ABUNDANT WBC PRESENT,BOTH PMN AND MONONUCLEAR NO ORGANISMS SEEN Performed at [facility], [address] [place], [place] [address]    Culture FEW ENTEROCOCCUS FAECALIS  Final   Report Status 05/13/2018 FINAL  Final   Organism ID, Bacteria ENTEROCOCCUS FAECALIS  Final      Susceptibility   Enterococcus faecalis - MIC*    AMPICILLIN <=2 SENSITIVE Sensitive     VANCOMYCIN 2 SENSITIVE Sensitive     GENTAMICIN SYNERGY SENSITIVE Sensitive     * FEW ENTEROCOCCUS FAECALIS  C difficile quick scan w PCR reflex     Status: Abnormal   Collection Time: 05/21/18 11:44 AM  Result Value Ref Range Status   C Diff antigen POSITIVE (A) NEGATIVE Final   C Diff toxin NEGATIVE NEGATIVE Final   C Diff interpretation Results are indeterminate. See PCR results.  Final    Comment: Performed at [facility], [place] [address] [place], [place] [address]  C. Diff by PCR, Reflexed     Status: Abnormal   Collection Time: 05/21/18 11:44 AM  Result Value Ref Range Status   Toxigenic C. Difficile by PCR POSITIVE (A) NEGATIVE Final    Comment: Positive for toxigenic C. difficile with little to no toxin production. Only treat if  clinical presentation suggests symptomatic illness. Performed at [facility], [place] [address], [place], [place] [address]     Coagulation Studies: No results for input(s): LABPROT, INR in the last 72 hours.  Urinalysis: No results for input(s): COLORURINE, LABSPEC, PHURINE, GLUCOSEU, HGBUR, BILIRUBINUR, KETONESUR, PROTEINUR, UROBILINOGEN, NITRITE, LEUKOCYTESUR in the last 72 hours.  Invalid input(s): APPERANCEUR    Imaging: No results found.   Medications:  Assessment/ Plan:  73 y.o. male with a PMHx of COPD, end-stage renal disease, hypertension, hepatitis C, cirrhosis, chronic systolic heart failure, depression, anemia of chronic kidney disease, secondary hyperparathyroidism, seizure disorder, who was admitted to [facility] on 05/03/2018 for ongoing treatment of respiratory failure and end-stage renal disease.   1.  ESRD on HD.    Patient seen and evaluated during hemodialysis and tolerating well.  Blood flow rate 400.  Ultrafiltration target 1.5 kg.  2.  Acute respiratory failure.    Doing well post extubation.  Breathing comfortably.  Continue ultrafiltration with dialysis to improve respiratory status.  3.  Anemia of chronic kidney disease.    Hemoglobin now up to 10.2 and has significantly improved.  Continue to monitor hemoglobin trend.  4.  Secondary hyperparathyroidism.    Phosphorus within target range at 5.2.  Has improved as compared to last week.   LOS: 0 Jaziyah Gradel 4/1/202010:20 AM

## 2018-06-01 DIAGNOSIS — J9621 Acute and chronic respiratory failure with hypoxia: Secondary | ICD-10-CM | POA: Diagnosis not present

## 2018-06-01 DIAGNOSIS — I5022 Chronic systolic (congestive) heart failure: Secondary | ICD-10-CM | POA: Diagnosis not present

## 2018-06-01 DIAGNOSIS — J181 Lobar pneumonia, unspecified organism: Secondary | ICD-10-CM | POA: Diagnosis not present

## 2018-06-01 DIAGNOSIS — N186 End stage renal disease: Secondary | ICD-10-CM | POA: Diagnosis not present

## 2018-06-01 NOTE — Progress Notes (Signed)
Pulmonary Critical Care Medicine [facility]   PULMONARY CRITICAL CARE SERVICE  PROGRESS NOTE  Date of Service: 06/01/2018  Jared Hancock  JOA:[id number]  DOB: 20-Oct-1945   DOA: 05/03/2018  Referring Physician: Merton Border, MD  HPI: Jared Hancock is a 73 y.o. male seen for follow up of Acute on Chronic Respiratory Failure.  Seems to be doing well has some x-ray had shown improvement right now he is remains off the ventilator  Medications: Reviewed on Rounds  Physical Exam:  Vitals: Temperature 98.0 pulse 84 respiratory 13 blood pressure 108/59 saturation 97%  Ventilator Settings currently off the ventilator  . General: Comfortable at this time . Eyes: Grossly normal lids, irises & conjunctiva . ENT: grossly tongue is normal . Neck: no obvious mass . Cardiovascular: S1 S2 normal no gallop . Respiratory: No rhonchi or rales are noted at this time . Abdomen: soft . Skin: no rash seen on limited exam . Musculoskeletal: not rigid . Psychiatric:unable to assess . Neurologic: no seizure no involuntary movements         Lab Data:   Basic Metabolic Panel: Recent Labs  Lab 05/26/18 1540 05/29/18 0710 05/31/18 0619  NA 134* 137 135  K 4.8 5.5* 5.5*  CL 97* 97* 97*  CO2 25 24 25   GLUCOSE 105* 89 86  BUN 44* 43* 37*  CREATININE 7.61* 8.58* 7.61*  CALCIUM 9.0 9.2 9.5  PHOS 4.1 4.4 5.2*    ABG: No results for input(s): PHART, PCO2ART, PO2ART, HCO3, O2SAT in the last 168 hours.  Liver Function Tests: Recent Labs  Lab 05/26/18 1540 05/29/18 0710 05/31/18 0619  ALBUMIN 2.4* 2.6* 2.7*   No results for input(s): LIPASE, AMYLASE in the last 168 hours. No results for input(s): AMMONIA in the last 168 hours.  CBC: Recent Labs  Lab 05/26/18 1540 05/29/18 0710 05/31/18 0619  WBC 6.1 6.0 5.3  HGB 8.2* 9.1* 10.2*  HCT 26.8* 30.7* 32.7*  MCV 103.5* 103.7* 103.2*  PLT 230 213 160    Cardiac Enzymes: No results for input(s): CKTOTAL, CKMB, CKMBINDEX,  TROPONINI in the last 168 hours.  BNP (last 3 results) No results for input(s): BNP in the last 8760 hours.  ProBNP (last 3 results) No results for input(s): PROBNP in the last 8760 hours.  Radiological Exams: No results found.  Assessment/Plan Active Problems:   Acute on chronic respiratory failure with hypoxia (HCC)   Seizure disorder (HCC)   End stage renal disease on dialysis ([place])   Lobar pneumonia (HCC)   Chronic systolic heart failure ([place])   1. Acute on chronic respiratory failure with hypoxia we will continue with oxygen therapy as necessary.  Patient's right now on room air 2. Seizure disorder no active seizures 3. End-stage renal disease on dialysis 4. Lobar pneumonia treated we will continue with present management 5. Chronic systolic heart failure compensated we will continue to monitor fluid status   I have personally seen and evaluated the patient, evaluated laboratory and imaging results, formulated the assessment and plan and placed orders. The Patient requires high complexity decision making for assessment and support.  Case was discussed on Rounds with the Respiratory Therapy Staff  Allyne Gee, MD [facility] Pulmonary Critical Care Medicine Sleep Medicine

## 2018-06-02 LAB — CBC
HCT: 34 % — ABNORMAL LOW (ref 39.0–52.0)
Hemoglobin: 10.5 g/dL — ABNORMAL LOW (ref 13.0–17.0)
MCH: 31.6 pg (ref 26.0–34.0)
MCHC: 30.9 g/dL (ref 30.0–36.0)
MCV: 102.4 fL — ABNORMAL HIGH (ref 80.0–100.0)
Platelets: 120 10*3/uL — ABNORMAL LOW (ref 150–400)
RBC: 3.32 MIL/uL — ABNORMAL LOW (ref 4.22–5.81)
RDW: 18.1 % — ABNORMAL HIGH (ref 11.5–15.5)
WBC: 4.2 10*3/uL (ref 4.0–10.5)
nRBC: 0 % (ref 0.0–0.2)

## 2018-06-02 LAB — RENAL FUNCTION PANEL
Albumin: 2.9 g/dL — ABNORMAL LOW (ref 3.5–5.0)
Anion gap: 15 (ref 5–15)
BUN: 42 mg/dL — ABNORMAL HIGH (ref 8–23)
CO2: 24 mmol/L (ref 22–32)
Calcium: 9.7 mg/dL (ref 8.9–10.3)
Chloride: 97 mmol/L — ABNORMAL LOW (ref 98–111)
Creatinine, Ser: 7.71 mg/dL — ABNORMAL HIGH (ref 0.61–1.24)
GFR calc Af Amer: 7 mL/min — ABNORMAL LOW (ref >60)
GFR calc non Af Amer: 6 mL/min — ABNORMAL LOW (ref >60)
Glucose, Bld: 86 mg/dL (ref 70–99)
Phosphorus: 5.6 mg/dL — ABNORMAL HIGH (ref 2.5–4.6)
Potassium: 5.8 mmol/L — ABNORMAL HIGH (ref 3.5–5.1)
Sodium: 136 mmol/L (ref 135–145)

## 2018-06-02 NOTE — Progress Notes (Signed)
[facility]  ROUNDING NOTE   Subjective:  Patient seen and evaluated during hemodialysis. Tolerating well. Laying in bed comfortably.  Objective:  Vital signs in last 24 hours:  Temperature 97 pulse 85 respirations 14 blood pressure 133/65  Physical Exam: General: No acute distress  Head: [place]/AT hearing intact  Eyes: Anicteric  Neck: Supple, trachea midline  Lungs:  Scattered rhonchi, normal effort  Heart: S1S2 no rubs  Abdomen:  Soft, nontender, bowel sounds present  Extremities: trace peripheral edema.  Right arm swelling.  Neurologic: Awake, alert, follows commands  Skin: No lesions  Access: R IJ permcath    Basic Metabolic Panel: Recent Labs  Lab 05/26/18 1540 05/29/18 0710 05/31/18 0619 06/02/18 0627  NA 134* 137 135 136  K 4.8 5.5* 5.5* 5.8*  CL 97* 97* 97* 97*  CO2 25 24 25 24   GLUCOSE 105* 89 86 86  BUN 44* 43* 37* 42*  CREATININE 7.61* 8.58* 7.61* 7.71*  CALCIUM 9.0 9.2 9.5 9.7  PHOS 4.1 4.4 5.2* 5.6*    Liver Function Tests: Recent Labs  Lab 05/26/18 1540 05/29/18 0710 05/31/18 0619 06/02/18 0627  ALBUMIN 2.4* 2.6* 2.7* 2.9*   No results for input(s): LIPASE, AMYLASE in the last 168 hours. No results for input(s): AMMONIA in the last 168 hours.  CBC: Recent Labs  Lab 05/26/18 1540 05/29/18 0710 05/31/18 0619 06/02/18 0627  WBC 6.1 6.0 5.3 4.2  HGB 8.2* 9.1* 10.2* 10.5*  HCT 26.8* 30.7* 32.7* 34.0*  MCV 103.5* 103.7* 103.2* 102.4*  PLT 230 213 160 120*    Cardiac Enzymes: No results for input(s): CKTOTAL, CKMB, CKMBINDEX, TROPONINI in the last 168 hours.  BNP: Invalid input(s): POCBNP  CBG: No results for input(s): GLUCAP in the last 168 hours.  Microbiology: Results for orders placed or performed during the hospital encounter of 05/03/18  C difficile quick scan w PCR reflex     Status: Abnormal   Collection Time: 05/03/18  7:40 PM  Result Value Ref Range Status   C Diff antigen POSITIVE (A) NEGATIVE Final   C  Diff toxin NEGATIVE NEGATIVE Final   C Diff interpretation Results are indeterminate. See PCR results.  Final    Comment: Performed at [facility], [place] [address] [place], [place] [address]  C. Diff by PCR, Reflexed     Status: Abnormal   Collection Time: 05/03/18  7:40 PM  Result Value Ref Range Status   Toxigenic C. Difficile by PCR POSITIVE (A) NEGATIVE Final    Comment: Positive for toxigenic C. difficile with little to no toxin production. Only treat if clinical presentation suggests symptomatic illness. Performed at [facility], [place] [address] [place], [place] [address]   Expectorated sputum assessment w rflx to resp cult     Status: None   Collection Time: 05/04/18  9:21 AM  Result Value Ref Range Status   Specimen Description EXPECTORATED SPUTUM  Final   Special Requests NONE  Final   Sputum evaluation   Final    THIS SPECIMEN IS ACCEPTABLE FOR SPUTUM CULTURE Performed at [facility], [place] [address], [place], [place] [address]    Report Status 05/04/2018 FINAL  Final  Culture, respiratory     Status: None   Collection Time: 05/04/18  9:21 AM  Result Value Ref Range Status   Specimen Description EXPECTORATED SPUTUM  Final   Special Requests NONE Reflexed from [id number]  Final   Gram Stain   Final  ABUNDANT WBC PRESENT, PREDOMINANTLY PMN RARE SQUAMOUS EPITHELIAL CELLS PRESENT NO ORGANISMS SEEN    Culture   Final    FEW Consistent with normal respiratory flora. Performed at [facility], [place] [address], [place], [place] [address]    Report Status 05/06/2018 FINAL  Final  Culture, blood (routine x 2)     Status: None   Collection Time: 05/11/18  1:40 PM  Result Value Ref Range Status   Specimen Description BLOOD RIGHT HAND  Final   Special Requests   Final    BOTTLES DRAWN AEROBIC ONLY Blood Culture results may not be optimal due to an inadequate volume of blood received in culture bottles   Culture   Final    NO GROWTH 5 DAYS Performed at  [facility], [place] [address] [place], [place] [address]    Report Status 05/16/2018 FINAL  Final  Culture, blood (routine x 2)     Status: None   Collection Time: 05/11/18  1:45 PM  Result Value Ref Range Status   Specimen Description BLOOD RIGHT HAND  Final   Special Requests   Final    BOTTLES DRAWN AEROBIC ONLY Blood Culture results may not be optimal due to an inadequate volume of blood received in culture bottles   Culture   Final    NO GROWTH 5 DAYS Performed at [facility], [place] [address], [place], [place] [address]    Report Status 05/16/2018 FINAL  Final  Culture, respiratory     Status: None   Collection Time: 05/11/18  4:20 PM  Result Value Ref Range Status   Specimen Description SPUTUM  Final   Special Requests NONE  Final   Gram Stain   Final    ABUNDANT WBC PRESENT,BOTH PMN AND MONONUCLEAR NO ORGANISMS SEEN Performed at [facility], [address], [place], [place] [address]    Culture FEW ENTEROCOCCUS FAECALIS  Final   Report Status 05/13/2018 FINAL  Final   Organism ID, Bacteria ENTEROCOCCUS FAECALIS  Final      Susceptibility   Enterococcus faecalis - MIC*    AMPICILLIN <=2 SENSITIVE Sensitive     VANCOMYCIN 2 SENSITIVE Sensitive     GENTAMICIN SYNERGY SENSITIVE Sensitive     * FEW ENTEROCOCCUS FAECALIS  C difficile quick scan w PCR reflex     Status: Abnormal   Collection Time: 05/21/18 11:44 AM  Result Value Ref Range Status   C Diff antigen POSITIVE (A) NEGATIVE Final   C Diff toxin NEGATIVE NEGATIVE Final   C Diff interpretation Results are indeterminate. See PCR results.  Final    Comment: Performed at [facility], [place] [address], [place], [place] [address]  C. Diff by PCR, Reflexed     Status: Abnormal   Collection Time: 05/21/18 11:44 AM  Result Value Ref Range Status   Toxigenic C. Difficile by PCR POSITIVE (A) NEGATIVE Final    Comment: Positive for toxigenic C. difficile with little to no toxin production. Only treat  if clinical presentation suggests symptomatic illness. Performed at [facility], [place] [address], [place],  [address]     Coagulation Studies: No results for input(s): LABPROT, INR in the last 72 hours.  Urinalysis: No results for input(s): COLORURINE, LABSPEC, PHURINE, GLUCOSEU, HGBUR, BILIRUBINUR, KETONESUR, PROTEINUR, UROBILINOGEN, NITRITE, LEUKOCYTESUR in the last 72 hours.  Invalid input(s): APPERANCEUR    Imaging: No results found.   Medications:  Assessment/ Plan:  73 y.o. male with a PMHx of COPD, end-stage renal disease, hypertension, hepatitis C, cirrhosis, chronic systolic heart failure, depression, anemia of chronic kidney disease, secondary hyperparathyroidism, seizure disorder, who was admitted to [facility] on 05[facility]/2018 for ongoing treatment of respiratory failure and end-stage renal disease.   1.  ESRD on HD.    Patient seen and evaluated during hemodialysis.  Tolerating well.  Complete dialysis today.  Next Alysis treatment will be on Monday.  2.  Acute respiratory failure.    Patient continues to breathe comfortably post extubation.  Monitor respiratory status.  3.  Anemia of chronic kidney disease.    Hemoglobin currently 10.5 and at target.  Continue to monitor.  4.  Secondary hyperparathyroidism.    Phosphorus slightly high at 5.6.  Recheck serum phosphorus on Monday.   LOS: 0 Farzana Koci 4/3/20208:30 AM

## 2018-06-03 LAB — HEPATITIS B SURFACE ANTIGEN: Hepatitis B Surface Ag: NEGATIVE

## 2018-06-05 LAB — RENAL FUNCTION PANEL
Albumin: 3.1 g/dL — ABNORMAL LOW (ref 3.5–5.0)
Anion gap: 15 (ref 5–15)
BUN: 33 mg/dL — ABNORMAL HIGH (ref 8–23)
CO2: 24 mmol/L (ref 22–32)
Calcium: 9.2 mg/dL (ref 8.9–10.3)
Chloride: 94 mmol/L — ABNORMAL LOW (ref 98–111)
Creatinine, Ser: 5.82 mg/dL — ABNORMAL HIGH (ref 0.61–1.24)
GFR calc Af Amer: 10 mL/min — ABNORMAL LOW (ref >60)
GFR calc non Af Amer: 9 mL/min — ABNORMAL LOW (ref >60)
Glucose, Bld: 82 mg/dL (ref 70–99)
Phosphorus: 4.1 mg/dL (ref 2.5–4.6)
Potassium: 4.7 mmol/L (ref 3.5–5.1)
Sodium: 133 mmol/L — ABNORMAL LOW (ref 135–145)

## 2018-06-05 LAB — CBC
HCT: 34.1 % — ABNORMAL LOW (ref 39.0–52.0)
Hemoglobin: 10.2 g/dL — ABNORMAL LOW (ref 13.0–17.0)
MCH: 30.4 pg (ref 26.0–34.0)
MCHC: 29.9 g/dL — ABNORMAL LOW (ref 30.0–36.0)
MCV: 101.8 fL — ABNORMAL HIGH (ref 80.0–100.0)
Platelets: 124 10*3/uL — ABNORMAL LOW (ref 150–400)
RBC: 3.35 MIL/uL — ABNORMAL LOW (ref 4.22–5.81)
RDW: 17.2 % — ABNORMAL HIGH (ref 11.5–15.5)
WBC: 4.9 10*3/uL (ref 4.0–10.5)
nRBC: 0 % (ref 0.0–0.2)

## 2018-06-05 NOTE — Progress Notes (Signed)
[facility]  ROUNDING NOTE   Subjective:  Patient seen at bedside. Seen and evaluated during dialysis. Appears confused today.  Objective:  Vital signs in last 24 hours:  Temperature 97.6 pulse 74 respirations 16 blood pressure 123/62  Physical Exam: General: No acute distress  Head: [place]/AT hearing intact  Eyes: Anicteric  Neck: Supple, trachea midline  Lungs:  Scattered rhonchi, normal effort  Heart: S1S2 no rubs  Abdomen:  Soft, nontender, bowel sounds present  Extremities: trace peripheral edema.  Right arm swelling.  Neurologic: Awake, but confused  Skin: No lesions  Access: R IJ permcath    Basic Metabolic Panel: Recent Labs  Lab 05/31/18 0619 06/02/18 0627  NA 135 136  K 5.5* 5.8*  CL 97* 97*  CO2 25 24  GLUCOSE 86 86  BUN 37* 42*  CREATININE 7.61* 7.71*  CALCIUM 9.5 9.7  PHOS 5.2* 5.6*    Liver Function Tests: Recent Labs  Lab 05/31/18 0619 06/02/18 0627  ALBUMIN 2.7* 2.9*   No results for input(s): LIPASE, AMYLASE in the last 168 hours. No results for input(s): AMMONIA in the last 168 hours.  CBC: Recent Labs  Lab 05/31/18 0619 06/02/18 0627  WBC 5.3 4.2  HGB 10.2* 10.5*  HCT 32.7* 34.0*  MCV 103.2* 102.4*  PLT 160 120*    Cardiac Enzymes: No results for input(s): CKTOTAL, CKMB, CKMBINDEX, TROPONINI in the last 168 hours.  BNP: Invalid input(s): POCBNP  CBG: No results for input(s): GLUCAP in the last 168 hours.  Microbiology: Results for orders placed or performed during the hospital encounter of 05/03/18  C difficile quick scan w PCR reflex     Status: Abnormal   Collection Time: 05/03/18  7:40 PM  Result Value Ref Range Status   C Diff antigen POSITIVE (A) NEGATIVE Final   C Diff toxin NEGATIVE NEGATIVE Final   C Diff interpretation Results are indeterminate. See PCR results.  Final    Comment: Performed at [facility], [place] [address], [place], [place] [address]  C. Diff by PCR, Reflexed     Status:  Abnormal   Collection Time: 05/03/18  7:40 PM  Result Value Ref Range Status   Toxigenic C. Difficile by PCR POSITIVE (A) NEGATIVE Final    Comment: Positive for toxigenic C. difficile with little to no toxin production. Only treat if clinical presentation suggests symptomatic illness. Performed at [facility], [place] [address] [place], [place] [address]   Expectorated sputum assessment w rflx to resp cult     Status: None   Collection Time: 05/04/18  9:21 AM  Result Value Ref Range Status   Specimen Description EXPECTORATED SPUTUM  Final   Special Requests NONE  Final   Sputum evaluation   Final    THIS SPECIMEN IS ACCEPTABLE FOR SPUTUM CULTURE Performed at [facility], [place] [address] [place], [place] [address]    Report Status 05/04/2018 FINAL  Final  Culture, respiratory     Status: None   Collection Time: 05/04/18  9:21 AM  Result Value Ref Range Status   Specimen Description EXPECTORATED SPUTUM  Final   Special Requests NONE Reflexed from [id number]  Final   Gram Stain   Final    ABUNDANT WBC PRESENT, PREDOMINANTLY PMN RARE SQUAMOUS EPITHELIAL CELLS PRESENT NO ORGANISMS SEEN    Culture   Final    FEW Consistent with normal respiratory flora. Performed at [facility], [place] [address], [place],  [address]  Report Status 05/06/2018 FINAL  Final  Culture, blood (routine x 2)     Status: None   Collection Time: 05/11/18  1:40 PM  Result Value Ref Range Status   Specimen Description BLOOD RIGHT HAND  Final   Special Requests   Final    BOTTLES DRAWN AEROBIC ONLY Blood Culture results may not be optimal due to an inadequate volume of blood received in culture bottles   Culture   Final    NO GROWTH 5 DAYS Performed at [facility], [place] [address], [place], [place] [address]    Report Status 05/16/2018 FINAL  Final  Culture, blood (routine x 2)     Status: None   Collection Time: 05/11/18  1:45 PM  Result Value Ref Range Status   Specimen  Description BLOOD RIGHT HAND  Final   Special Requests   Final    BOTTLES DRAWN AEROBIC ONLY Blood Culture results may not be optimal due to an inadequate volume of blood received in culture bottles   Culture   Final    NO GROWTH 5 DAYS Performed at [facility], [place] [address], [place], [place] [address]    Report Status 05/16/2018 FINAL  Final  Culture, respiratory     Status: None   Collection Time: 05/11/18  4:20 PM  Result Value Ref Range Status   Specimen Description SPUTUM  Final   Special Requests NONE  Final   Gram Stain   Final    ABUNDANT WBC PRESENT,BOTH PMN AND MONONUCLEAR NO ORGANISMS SEEN Performed at [facility], [address] [place], [place] [address]    Culture FEW ENTEROCOCCUS FAECALIS  Final   Report Status 05/13/2018 FINAL  Final   Organism ID, Bacteria ENTEROCOCCUS FAECALIS  Final      Susceptibility   Enterococcus faecalis - MIC*    AMPICILLIN <=2 SENSITIVE Sensitive     VANCOMYCIN 2 SENSITIVE Sensitive     GENTAMICIN SYNERGY SENSITIVE Sensitive     * FEW ENTEROCOCCUS FAECALIS  C difficile quick scan w PCR reflex     Status: Abnormal   Collection Time: 05/21/18 11:44 AM  Result Value Ref Range Status   C Diff antigen POSITIVE (A) NEGATIVE Final   C Diff toxin NEGATIVE NEGATIVE Final   C Diff interpretation Results are indeterminate. See PCR results.  Final    Comment: Performed at [facility], [place] [address], [place], [place] [address]  C. Diff by PCR, Reflexed     Status: Abnormal   Collection Time: 05/21/18 11:44 AM  Result Value Ref Range Status   Toxigenic C. Difficile by PCR POSITIVE (A) NEGATIVE Final    Comment: Positive for toxigenic C. difficile with little to no toxin production. Only treat if clinical presentation suggests symptomatic illness. Performed at [facility], [place] [address], [place], [place] [address]     Coagulation Studies: No results for input(s): LABPROT, INR in the last 72  hours.  Urinalysis: No results for input(s): COLORURINE, LABSPEC, PHURINE, GLUCOSEU, HGBUR, BILIRUBINUR, KETONESUR, PROTEINUR, UROBILINOGEN, NITRITE, LEUKOCYTESUR in the last 72 hours.  Invalid input(s): APPERANCEUR    Imaging: No results found.   Medications:       Assessment/ Plan:  73 y.o. male with a PMHx of COPD, end-stage renal disease, hypertension, hepatitis C, cirrhosis, chronic systolic heart failure, depression, anemia of chronic kidney disease, secondary hyperparathyroidism, seizure disorder, who was admitted to [facility] on 05/03/2018 for ongoing treatment of respiratory failure  and end-stage renal disease.   1.  ESRD on HD.    Patient seen and evaluated during hemodialysis.  Ultrafiltration target 2 kg today.  Next dialysis will be on Wednesday.  2.  Acute respiratory failure.    Continues to do well off of the ventilator.  Monitor respiratory status.  3.  Anemia of chronic kidney disease.    Hemoglobin up to 10.5.  Continue to monitor.  4.  Secondary hyperparathyroidism.    Phosphorus currently 5.6 and should come down with additional dialysis treatment.   LOS: 0 Daje Stark 4/6/20208:48 AM

## 2018-06-07 LAB — CBC
HCT: 35.4 % — ABNORMAL LOW (ref 39.0–52.0)
Hemoglobin: 11.1 g/dL — ABNORMAL LOW (ref 13.0–17.0)
MCH: 31.8 pg (ref 26.0–34.0)
MCHC: 31.4 g/dL (ref 30.0–36.0)
MCV: 101.4 fL — ABNORMAL HIGH (ref 80.0–100.0)
Platelets: 133 10*3/uL — ABNORMAL LOW (ref 150–400)
RBC: 3.49 MIL/uL — ABNORMAL LOW (ref 4.22–5.81)
RDW: 17.2 % — ABNORMAL HIGH (ref 11.5–15.5)
WBC: 4.5 10*3/uL (ref 4.0–10.5)
nRBC: 0 % (ref 0.0–0.2)

## 2018-06-07 LAB — POTASSIUM: Potassium: 5.8 mmol/L — ABNORMAL HIGH (ref 3.5–5.1)

## 2018-06-07 LAB — RENAL FUNCTION PANEL
Albumin: 3.1 g/dL — ABNORMAL LOW (ref 3.5–5.0)
Anion gap: 14 (ref 5–15)
BUN: 44 mg/dL — ABNORMAL HIGH (ref 8–23)
CO2: 26 mmol/L (ref 22–32)
Calcium: 9.8 mg/dL (ref 8.9–10.3)
Chloride: 94 mmol/L — ABNORMAL LOW (ref 98–111)
Creatinine, Ser: 7.88 mg/dL — ABNORMAL HIGH (ref 0.61–1.24)
GFR calc Af Amer: 7 mL/min — ABNORMAL LOW (ref >60)
GFR calc non Af Amer: 6 mL/min — ABNORMAL LOW (ref >60)
Glucose, Bld: 88 mg/dL (ref 70–99)
Phosphorus: 5.9 mg/dL — ABNORMAL HIGH (ref 2.5–4.6)
Potassium: 6.1 mmol/L — ABNORMAL HIGH (ref 3.5–5.1)
Sodium: 134 mmol/L — ABNORMAL LOW (ref 135–145)

## 2018-06-07 NOTE — Progress Notes (Signed)
[facility]  ROUNDING NOTE   Subjective:  Patient seen and evaluated during hemodialysis. Tolerating well.  Objective:  Vital signs in last 24 hours:  Temperature 96.8 pulse 57 respirations  Blood pressure 119/42  Physical Exam: General: No acute distress  Head: [place]/AT hearing intact  Eyes: Anicteric  Neck: Supple, trachea midline  Lungs:  Scattered rhonchi, normal effort  Heart: S1S2 no rubs  Abdomen:  Soft, nontender, bowel sounds present  Extremities: trace peripheral edema.  Right arm swelling.  Neurologic: Awake, alert  Skin: No lesions  Access: R IJ permcath    Basic Metabolic Panel: Recent Labs  Lab 06/02/18 0627 06/05/18 0810  NA 136 133*  K 5.8* 4.7  CL 97* 94*  CO2 24 24  GLUCOSE 86 82  BUN 42* 33*  CREATININE 7.71* 5.82*  CALCIUM 9.7 9.2  PHOS 5.6* 4.1    Liver Function Tests: Recent Labs  Lab 06/02/18 0627 06/05/18 0810  ALBUMIN 2.9* 3.1*   No results for input(s): LIPASE, AMYLASE in the last 168 hours. No results for input(s): AMMONIA in the last 168 hours.  CBC: Recent Labs  Lab 06/02/18 0627 06/05/18 0810  WBC 4.2 4.9  HGB 10.5* 10.2*  HCT 34.0* 34.1*  MCV 102.4* 101.8*  PLT 120* 124*    Cardiac Enzymes: No results for input(s): CKTOTAL, CKMB, CKMBINDEX, TROPONINI in the last 168 hours.  BNP: Invalid input(s): POCBNP  CBG: No results for input(s): GLUCAP in the last 168 hours.  Microbiology: Results for orders placed or performed during the hospital encounter of 05/03/18  C difficile quick scan w PCR reflex     Status: Abnormal   Collection Time: 05/03/18  7:40 PM  Result Value Ref Range Status   C Diff antigen POSITIVE (A) NEGATIVE Final   C Diff toxin NEGATIVE NEGATIVE Final   C Diff interpretation Results are indeterminate. See PCR results.  Final    Comment: Performed at [facility], [place] [address] [place], [place] [address]  C. Diff by PCR, Reflexed     Status: Abnormal   Collection Time:  05/03/18  7:40 PM  Result Value Ref Range Status   Toxigenic C. Difficile by PCR POSITIVE (A) NEGATIVE Final    Comment: Positive for toxigenic C. difficile with little to no toxin production. Only treat if clinical presentation suggests symptomatic illness. Performed at [facility], [place] [address] [place], [place] [address]   Expectorated sputum assessment w rflx to resp cult     Status: None   Collection Time: 05/04/18  9:21 AM  Result Value Ref Range Status   Specimen Description EXPECTORATED SPUTUM  Final   Special Requests NONE  Final   Sputum evaluation   Final    THIS SPECIMEN IS ACCEPTABLE FOR SPUTUM CULTURE Performed at [facility], [place] [address] [place], [place] [address]    Report Status 05/04/2018 FINAL  Final  Culture, respiratory     Status: None   Collection Time: 05/04/18  9:21 AM  Result Value Ref Range Status   Specimen Description EXPECTORATED SPUTUM  Final   Special Requests NONE Reflexed from [id number]  Final   Gram Stain   Final    ABUNDANT WBC PRESENT, PREDOMINANTLY PMN RARE SQUAMOUS EPITHELIAL CELLS PRESENT NO ORGANISMS SEEN    Culture   Final    FEW Consistent with normal respiratory flora. Performed at [facility], [place] [address] [place], [place] [address]    Report Status  05/06/2018 FINAL  Final  Culture, blood (routine x 2)     Status: None   Collection Time: 05/11/18  1:40 PM  Result Value Ref Range Status   Specimen Description BLOOD RIGHT HAND  Final   Special Requests   Final    BOTTLES DRAWN AEROBIC ONLY Blood Culture results may not be optimal due to an inadequate volume of blood received in culture bottles   Culture   Final    NO GROWTH 5 DAYS Performed at [facility], [place] [address] [place], [place] [address]    Report Status 05/16/2018 FINAL  Final  Culture, blood (routine x 2)     Status: None   Collection Time: 05/11/18  1:45 PM  Result Value Ref Range Status   Specimen Description BLOOD RIGHT HAND   Final   Special Requests   Final    BOTTLES DRAWN AEROBIC ONLY Blood Culture results may not be optimal due to an inadequate volume of blood received in culture bottles   Culture   Final    NO GROWTH 5 DAYS Performed at [facility], [place] [address], [place], [place] [address]    Report Status 05/16/2018 FINAL  Final  Culture, respiratory     Status: None   Collection Time: 05/11/18  4:20 PM  Result Value Ref Range Status   Specimen Description SPUTUM  Final   Special Requests NONE  Final   Gram Stain   Final    ABUNDANT WBC PRESENT,BOTH PMN AND MONONUCLEAR NO ORGANISMS SEEN Performed at [facility], [address] [place], [place] [address]    Culture FEW ENTEROCOCCUS FAECALIS  Final   Report Status 05/13/2018 FINAL  Final   Organism ID, Bacteria ENTEROCOCCUS FAECALIS  Final      Susceptibility   Enterococcus faecalis - MIC*    AMPICILLIN <=2 SENSITIVE Sensitive     VANCOMYCIN 2 SENSITIVE Sensitive     GENTAMICIN SYNERGY SENSITIVE Sensitive     * FEW ENTEROCOCCUS FAECALIS  C difficile quick scan w PCR reflex     Status: Abnormal   Collection Time: 05/21/18 11:44 AM  Result Value Ref Range Status   C Diff antigen POSITIVE (A) NEGATIVE Final   C Diff toxin NEGATIVE NEGATIVE Final   C Diff interpretation Results are indeterminate. See PCR results.  Final    Comment: Performed at [facility], [place] [address] [place], [place] [address]  C. Diff by PCR, Reflexed     Status: Abnormal   Collection Time: 05/21/18 11:44 AM  Result Value Ref Range Status   Toxigenic C. Difficile by PCR POSITIVE (A) NEGATIVE Final    Comment: Positive for toxigenic C. difficile with little to no toxin production. Only treat if clinical presentation suggests symptomatic illness. Performed at [facility], [place] [address], [place],  [address]     Coagulation Studies: No results for input(s): LABPROT, INR in the last 72 hours.  Urinalysis: No results for input(s):  COLORURINE, LABSPEC, PHURINE, GLUCOSEU, HGBUR, BILIRUBINUR, KETONESUR, PROTEINUR, UROBILINOGEN, NITRITE, LEUKOCYTESUR in the last 72 hours.  Invalid input(s): APPERANCEUR    Imaging: No results found.   Medications:       Assessment/ Plan:  73 y.o. male with a PMHx of COPD, end-stage renal disease, hypertension, hepatitis C, cirrhosis, chronic systolic heart failure, depression, anemia of chronic kidney disease, secondary hyperparathyroidism, seizure disorder, who was admitted to [facility] on [facility]/03/2018 for ongoing treatment of respiratory failure and end-stage  renal disease.   1.  ESRD on HD.    Patient seen and evaluated on hemodialysis treatment.  Tolerating well.  Complete dialysis treatment today and next treatment will be on Friday.  2.  Acute respiratory failure.    Patient weaned successfully from the ventilator and breathing comfortably.  3.  Anemia of chronic kidney disease.    Hemoglobin remains at target at 10.2.  Recheck on Friday.  4.  Secondary hyperparathyroidism.    Phosphorus down to 4.1 and within target range.  Continue to periodically monitor.   LOS: 0 Jared Hancock 4/8/20208:16 AM

## 2018-06-08 LAB — BASIC METABOLIC PANEL
Anion gap: 14 (ref 5–15)
BUN: 24 mg/dL — ABNORMAL HIGH (ref 8–23)
CO2: 25 mmol/L (ref 22–32)
Calcium: 9.6 mg/dL (ref 8.9–10.3)
Chloride: 95 mmol/L — ABNORMAL LOW (ref 98–111)
Creatinine, Ser: 5.76 mg/dL — ABNORMAL HIGH (ref 0.61–1.24)
GFR calc Af Amer: 11 mL/min — ABNORMAL LOW (ref >60)
GFR calc non Af Amer: 9 mL/min — ABNORMAL LOW (ref >60)
Glucose, Bld: 79 mg/dL (ref 70–99)
Potassium: 6.4 mmol/L (ref 3.5–5.1)
Sodium: 134 mmol/L — ABNORMAL LOW (ref 135–145)

## 2018-06-08 LAB — POTASSIUM: Potassium: 5.3 mmol/L — ABNORMAL HIGH (ref 3.5–5.1)

## 2018-06-09 LAB — RENAL FUNCTION PANEL
Albumin: 3.1 g/dL — ABNORMAL LOW (ref 3.5–5.0)
Anion gap: 16 — ABNORMAL HIGH (ref 5–15)
BUN: 43 mg/dL — ABNORMAL HIGH (ref 8–23)
CO2: 25 mmol/L (ref 22–32)
Calcium: 9.2 mg/dL (ref 8.9–10.3)
Chloride: 96 mmol/L — ABNORMAL LOW (ref 98–111)
Creatinine, Ser: 7.76 mg/dL — ABNORMAL HIGH (ref 0.61–1.24)
GFR calc Af Amer: 7 mL/min — ABNORMAL LOW (ref >60)
GFR calc non Af Amer: 6 mL/min — ABNORMAL LOW (ref >60)
Glucose, Bld: 85 mg/dL (ref 70–99)
Phosphorus: 7.6 mg/dL — ABNORMAL HIGH (ref 2.5–4.6)
Potassium: 4.7 mmol/L (ref 3.5–5.1)
Sodium: 137 mmol/L (ref 135–145)

## 2018-06-09 LAB — CBC
HCT: 34.9 % — ABNORMAL LOW (ref 39.0–52.0)
Hemoglobin: 10.7 g/dL — ABNORMAL LOW (ref 13.0–17.0)
MCH: 30.7 pg (ref 26.0–34.0)
MCHC: 30.7 g/dL (ref 30.0–36.0)
MCV: 100 fL (ref 80.0–100.0)
Platelets: 116 10*3/uL — ABNORMAL LOW (ref 150–400)
RBC: 3.49 MIL/uL — ABNORMAL LOW (ref 4.22–5.81)
RDW: 17 % — ABNORMAL HIGH (ref 11.5–15.5)
WBC: 4 10*3/uL (ref 4.0–10.5)
nRBC: 0 % (ref 0.0–0.2)

## 2018-06-09 NOTE — Progress Notes (Signed)
[facility]  ROUNDING NOTE   Subjective:  Patient seen and evaluated during hemodialysis.  Tolerating well.  Laying in bed currently.  Objective:  Vital signs in last 24 hours:  Temperature 97.1 pulse 76 respirations 18 blood pressure 194/97   Physical Exam: General: No acute distress  Head: [place]/AT hearing intact  Eyes: Anicteric  Neck: Supple, trachea midline  Lungs:  Scattered rhonchi, normal effort  Heart: S1S2 no rubs  Abdomen:  Soft, nontender, bowel sounds present  Extremities: trace peripheral edema.  Right arm swelling.  Neurologic: Awake, alert  Skin: No lesions  Access: R IJ permcath    Basic Metabolic Panel: Recent Labs  Lab 06/05/18 0810 06/07/18 0500 06/07/18 1704 06/08/18 0617 06/08/18 1118 06/09/18 0657  NA 133* 134*  --  134*  --  137  K 4.7 6.1* 5.8* 6.4* 5.3* 4.7  CL 94* 94*  --  95*  --  96*  CO2 24 26  --  25  --  25  GLUCOSE 82 88  --  79  --  85  BUN 33* 44*  --  24*  --  43*  CREATININE 5.82* 7.88*  --  5.76*  --  7.76*  CALCIUM 9.2 9.8  --  9.6  --  9.2  PHOS 4.1 5.9*  --   --   --  7.6*    Liver Function Tests: Recent Labs  Lab 06/05/18 0810 06/07/18 0500 06/09/18 0657  ALBUMIN 3.1* 3.1* 3.1*   No results for input(s): LIPASE, AMYLASE in the last 168 hours. No results for input(s): AMMONIA in the last 168 hours.  CBC: Recent Labs  Lab 06/05/18 0810 06/07/18 0625 06/09/18 0657  WBC 4.9 4.5 4.0  HGB 10.2* 11.1* 10.7*  HCT 34.1* 35.4* 34.9*  MCV 101.8* 101.4* 100.0  PLT 124* 133* 116*    Cardiac Enzymes: No results for input(s): CKTOTAL, CKMB, CKMBINDEX, TROPONINI in the last 168 hours.  BNP: Invalid input(s): POCBNP  CBG: No results for input(s): GLUCAP in the last 168 hours.  Microbiology: Results for orders placed or performed during the hospital encounter of 05/03/18  C difficile quick scan w PCR reflex     Status: Abnormal   Collection Time: 05/03/18  7:40 PM  Result Value Ref Range Status   C  Diff antigen POSITIVE (A) NEGATIVE Final   C Diff toxin NEGATIVE NEGATIVE Final   C Diff interpretation Results are indeterminate. See PCR results.  Final    Comment: Performed at [facility], [place] [address] [place], [place] [address]  C. Diff by PCR, Reflexed     Status: Abnormal   Collection Time: 05/03/18  7:40 PM  Result Value Ref Range Status   Toxigenic C. Difficile by PCR POSITIVE (A) NEGATIVE Final    Comment: Positive for toxigenic C. difficile with little to no toxin production. Only treat if clinical presentation suggests symptomatic illness. Performed at [facility], [place] [address], [place], [place] [address]   Expectorated sputum assessment w rflx to resp cult     Status: None   Collection Time: 05/04/18  9:21 AM  Result Value Ref Range Status   Specimen Description EXPECTORATED SPUTUM  Final   Special Requests NONE  Final   Sputum evaluation   Final    THIS SPECIMEN IS ACCEPTABLE FOR SPUTUM CULTURE Performed at [facility], [place] [address] [place], [place] [address]    Report Status 05/04/2018 FINAL  Final  Culture, respiratory  Status: None   Collection Time: 05/04/18  9:21 AM  Result Value Ref Range Status   Specimen Description EXPECTORATED SPUTUM  Final   Special Requests NONE Reflexed from [id number]  Final   Gram Stain   Final    ABUNDANT WBC PRESENT, PREDOMINANTLY PMN RARE SQUAMOUS EPITHELIAL CELLS PRESENT NO ORGANISMS SEEN    Culture   Final    FEW Consistent with normal respiratory flora. Performed at [facility], [place] [address] [place], [place] [address]    Report Status 05/06/2018 FINAL  Final  Culture, blood (routine x 2)     Status: None   Collection Time: 05/11/18  1:40 PM  Result Value Ref Range Status   Specimen Description BLOOD RIGHT HAND  Final   Special Requests   Final    BOTTLES DRAWN AEROBIC ONLY Blood Culture results may not be optimal due to an inadequate volume of blood received in culture bottles   Culture    Final    NO GROWTH 5 DAYS Performed at [facility], [place] [address], [place], [place] [address]    Report Status 05/16/2018 FINAL  Final  Culture, blood (routine x 2)     Status: None   Collection Time: 05/11/18  1:45 PM  Result Value Ref Range Status   Specimen Description BLOOD RIGHT HAND  Final   Special Requests   Final    BOTTLES DRAWN AEROBIC ONLY Blood Culture results may not be optimal due to an inadequate volume of blood received in culture bottles   Culture   Final    NO GROWTH 5 DAYS Performed at [facility], [place] [address], [place], [place] [address]    Report Status 05/16/2018 FINAL  Final  Culture, respiratory     Status: None   Collection Time: 05/11/18  4:20 PM  Result Value Ref Range Status   Specimen Description SPUTUM  Final   Special Requests NONE  Final   Gram Stain   Final    ABUNDANT WBC PRESENT,BOTH PMN AND MONONUCLEAR NO ORGANISMS SEEN Performed at [facility], [address] [place], [place] [address]    Culture FEW ENTEROCOCCUS FAECALIS  Final   Report Status 05/13/2018 FINAL  Final   Organism ID, Bacteria ENTEROCOCCUS FAECALIS  Final      Susceptibility   Enterococcus faecalis - MIC*    AMPICILLIN <=2 SENSITIVE Sensitive     VANCOMYCIN 2 SENSITIVE Sensitive     GENTAMICIN SYNERGY SENSITIVE Sensitive     * FEW ENTEROCOCCUS FAECALIS  C difficile quick scan w PCR reflex     Status: Abnormal   Collection Time: 05/21/18 11:44 AM  Result Value Ref Range Status   C Diff antigen POSITIVE (A) NEGATIVE Final   C Diff toxin NEGATIVE NEGATIVE Final   C Diff interpretation Results are indeterminate. See PCR results.  Final    Comment: Performed at [facility], [place] [address] [place], [place] [address]  C. Diff by PCR, Reflexed     Status: Abnormal   Collection Time: 05/21/18 11:44 AM  Result Value Ref Range Status   Toxigenic C. Difficile by PCR POSITIVE (A) NEGATIVE Final    Comment: Positive for toxigenic C. difficile with  little to no toxin production. Only treat if clinical presentation suggests symptomatic illness. Performed at [facility], [place] [address], [place], [place] [address]     Coagulation Studies: No results for input(s): LABPROT, INR in the last  72 hours.  Urinalysis: No results for input(s): COLORURINE, LABSPEC, PHURINE, GLUCOSEU, HGBUR, BILIRUBINUR, KETONESUR, PROTEINUR, UROBILINOGEN, NITRITE, LEUKOCYTESUR in the last 72 hours.  Invalid input(s): APPERANCEUR    Imaging: No results found.   Medications:       Assessment/ Plan:  73 y.o. male with a PMHx of COPD, end-stage renal disease, hypertension, hepatitis C, cirrhosis, chronic systolic heart failure, depression, anemia of chronic kidney disease, secondary hyperparathyroidism, seizure disorder, who was admitted to [facility] on 05/03/2018 for ongoing treatment of respiratory failure and end-stage renal disease.   1.  ESRD on HD.    Patient seen and evaluated during hemodialysis.  Tolerating well.  We plan to complete dialysis treatment today.  2.  Acute respiratory failure.    Patient continues to do well off of the ventilator.  Breathing comfortably.  3.  Anemia of chronic kidney disease.    Hemoglobin up to 10.7 and remains at target.  4.  Secondary hyperparathyroidism.    Phosphorus currently 7.6.  Repeat phosphorus on Monday.   LOS: 0 Rutherford Alarie 4/10/202010:46 AM

## 2018-06-11 ENCOUNTER — Other Ambulatory Visit (HOSPITAL_COMMUNITY): Payer: Medicaid Other

## 2018-06-12 LAB — CBC
HCT: 31.6 % — ABNORMAL LOW (ref 39.0–52.0)
Hemoglobin: 10.1 g/dL — ABNORMAL LOW (ref 13.0–17.0)
MCH: 32 pg (ref 26.0–34.0)
MCHC: 32 g/dL (ref 30.0–36.0)
MCV: 100 fL (ref 80.0–100.0)
Platelets: 144 10*3/uL — ABNORMAL LOW (ref 150–400)
RBC: 3.16 MIL/uL — ABNORMAL LOW (ref 4.22–5.81)
RDW: 17.2 % — ABNORMAL HIGH (ref 11.5–15.5)
WBC: 8.5 10*3/uL (ref 4.0–10.5)
nRBC: 0 % (ref 0.0–0.2)

## 2018-06-12 LAB — RENAL FUNCTION PANEL
Albumin: 2.8 g/dL — ABNORMAL LOW (ref 3.5–5.0)
Anion gap: 21 — ABNORMAL HIGH (ref 5–15)
BUN: 72 mg/dL — ABNORMAL HIGH (ref 8–23)
CO2: 24 mmol/L (ref 22–32)
Calcium: 9.6 mg/dL (ref 8.9–10.3)
Chloride: 90 mmol/L — ABNORMAL LOW (ref 98–111)
Creatinine, Ser: 10.42 mg/dL — ABNORMAL HIGH (ref 0.61–1.24)
GFR calc Af Amer: 5 mL/min — ABNORMAL LOW (ref >60)
GFR calc non Af Amer: 4 mL/min — ABNORMAL LOW (ref >60)
Glucose, Bld: 96 mg/dL (ref 70–99)
Phosphorus: 8.5 mg/dL — ABNORMAL HIGH (ref 2.5–4.6)
Potassium: 3.6 mmol/L (ref 3.5–5.1)
Sodium: 135 mmol/L (ref 135–145)

## 2018-06-12 NOTE — Progress Notes (Signed)
[facility]  ROUNDING NOTE   Subjective:  Patient seen at bedside. Resting comfortably in bed.    Objective:  Vital signs in last 24 hours:  Temperature 96.9 pulse 62 respirations 20 blood pressure 159/79  Physical Exam: General: No acute distress  Head: [place]/AT hearing intact  Eyes: Anicteric  Neck: Supple, trachea midline  Lungs:  Scattered rhonchi, normal effort  Heart: S1S2 no rubs  Abdomen:  Soft, nontender, bowel sounds present  Extremities: trace peripheral edema.  Right arm swelling.  Neurologic: Awake, alert  Skin: No lesions  Access: R IJ permcath    Basic Metabolic Panel: Recent Labs  Lab 06/07/18 0500 06/07/18 1704 06/08/18 0617 06/08/18 1118 06/09/18 0657 06/12/18 0734  NA 134*  --  134*  --  137 135  K 6.1* 5.8* 6.4* 5.3* 4.7 3.6  CL 94*  --  95*  --  96* 90*  CO2 26  --  25  --  25 24  GLUCOSE 88  --  79  --  85 96  BUN 44*  --  24*  --  43* 72*  CREATININE 7.88*  --  5.76*  --  7.76* 10.42*  CALCIUM 9.8  --  9.6  --  9.2 9.6  PHOS 5.9*  --   --   --  7.6* 8.5*    Liver Function Tests: Recent Labs  Lab 06/07/18 0500 06/09/18 0657 06/12/18 0734  ALBUMIN 3.1* 3.1* 2.8*   No results for input(s): LIPASE, AMYLASE in the last 168 hours. No results for input(s): AMMONIA in the last 168 hours.  CBC: Recent Labs  Lab 06/07/18 0625 06/09/18 0657 06/12/18 0734  WBC 4.5 4.0 8.5  HGB 11.1* 10.7* 10.1*  HCT 35.4* 34.9* 31.6*  MCV 101.4* 100.0 100.0  PLT 133* 116* 144*    Cardiac Enzymes: No results for input(s): CKTOTAL, CKMB, CKMBINDEX, TROPONINI in the last 168 hours.  BNP: Invalid input(s): POCBNP  CBG: No results for input(s): GLUCAP in the last 168 hours.  Microbiology: Results for orders placed or performed during the hospital encounter of 05/03/18  C difficile quick scan w PCR reflex     Status: Abnormal   Collection Time: 05/03/18  7:40 PM  Result Value Ref Range Status   C Diff antigen POSITIVE (A) NEGATIVE  Final   C Diff toxin NEGATIVE NEGATIVE Final   C Diff interpretation Results are indeterminate. See PCR results.  Final    Comment: Performed at [facility], [place] [address], [place], [place] [address]  C. Diff by PCR, Reflexed     Status: Abnormal   Collection Time: 05/03/18  7:40 PM  Result Value Ref Range Status   Toxigenic C. Difficile by PCR POSITIVE (A) NEGATIVE Final    Comment: Positive for toxigenic C. difficile with little to no toxin production. Only treat if clinical presentation suggests symptomatic illness. Performed at [facility], [place] [address], [place], [place] [address]   Expectorated sputum assessment w rflx to resp cult     Status: None   Collection Time: 05/04/18  9:21 AM  Result Value Ref Range Status   Specimen Description EXPECTORATED SPUTUM  Final   Special Requests NONE  Final   Sputum evaluation   Final    THIS SPECIMEN IS ACCEPTABLE FOR SPUTUM CULTURE Performed at [facility], [place] [address] [place], [place] [address]    Report Status 05/04/2018 FINAL  Final  Culture, respiratory     Status:  None   Collection Time: 05/04/18  9:21 AM  Result Value Ref Range Status   Specimen Description EXPECTORATED SPUTUM  Final   Special Requests NONE Reflexed from [id number]  Final   Gram Stain   Final    ABUNDANT WBC PRESENT, PREDOMINANTLY PMN RARE SQUAMOUS EPITHELIAL CELLS PRESENT NO ORGANISMS SEEN    Culture   Final    FEW Consistent with normal respiratory flora. Performed at [facility], [place] [address], [place], [place] [address]    Report Status 05/06/2018 FINAL  Final  Culture, blood (routine x 2)     Status: None   Collection Time: 05/11/18  1:40 PM  Result Value Ref Range Status   Specimen Description BLOOD RIGHT HAND  Final   Special Requests   Final    BOTTLES DRAWN AEROBIC ONLY Blood Culture results may not be optimal due to an inadequate volume of blood received in culture bottles   Culture   Final    NO GROWTH 5  DAYS Performed at [facility], [place] [address] [place], [place] [address]    Report Status 05/16/2018 FINAL  Final  Culture, blood (routine x 2)     Status: None   Collection Time: 05/11/18  1:45 PM  Result Value Ref Range Status   Specimen Description BLOOD RIGHT HAND  Final   Special Requests   Final    BOTTLES DRAWN AEROBIC ONLY Blood Culture results may not be optimal due to an inadequate volume of blood received in culture bottles   Culture   Final    NO GROWTH 5 DAYS Performed at [facility], [place] [address] [place], [place] [address]    Report Status 05/16/2018 FINAL  Final  Culture, respiratory     Status: None   Collection Time: 05/11/18  4:20 PM  Result Value Ref Range Status   Specimen Description SPUTUM  Final   Special Requests NONE  Final   Gram Stain   Final    ABUNDANT WBC PRESENT,BOTH PMN AND MONONUCLEAR NO ORGANISMS SEEN Performed at [facility], [address] [place], [place] [address]    Culture FEW ENTEROCOCCUS FAECALIS  Final   Report Status 05/13/2018 FINAL  Final   Organism ID, Bacteria ENTEROCOCCUS FAECALIS  Final      Susceptibility   Enterococcus faecalis - MIC*    AMPICILLIN <=2 SENSITIVE Sensitive     VANCOMYCIN 2 SENSITIVE Sensitive     GENTAMICIN SYNERGY SENSITIVE Sensitive     * FEW ENTEROCOCCUS FAECALIS  C difficile quick scan w PCR reflex     Status: Abnormal   Collection Time: 05/21/18 11:44 AM  Result Value Ref Range Status   C Diff antigen POSITIVE (A) NEGATIVE Final   C Diff toxin NEGATIVE NEGATIVE Final   C Diff interpretation Results are indeterminate. See PCR results.  Final    Comment: Performed at [facility], [place] [address], [place], [place] [address]  C. Diff by PCR, Reflexed     Status: Abnormal   Collection Time: 05/21/18 11:44 AM  Result Value Ref Range Status   Toxigenic C. Difficile by PCR POSITIVE (A) NEGATIVE Final    Comment: Positive for toxigenic C. difficile with little to no toxin  production. Only treat if clinical presentation suggests symptomatic illness. Performed at [facility], [place] [address], [place], [place] [address]   Culture, blood (routine x 2)     Status: None (Preliminary result)  Collection Time: 06/11/18  2:51 PM  Result Value Ref Range Status   Specimen Description BLOOD RIGHT HAND  Final   Special Requests   Final    BOTTLES DRAWN AEROBIC ONLY Blood Culture adequate volume   Culture   Final    NO GROWTH < 24 HOURS Performed at [facility], [address] [place], [place] [address]    Report Status PENDING  Incomplete  Culture, blood (routine x 2)     Status: None (Preliminary result)   Collection Time: 06/11/18  2:51 PM  Result Value Ref Range Status   Specimen Description BLOOD WRIST RIGHT  Final   Special Requests   Final    BOTTLES DRAWN AEROBIC ONLY Blood Culture adequate volume   Culture   Final    NO GROWTH < 24 HOURS Performed at [facility], [place] [address], [place], [place] [address]    Report Status PENDING  Incomplete    Coagulation Studies: No results for input(s): LABPROT, INR in the last 72 hours.  Urinalysis: No results for input(s): COLORURINE, LABSPEC, PHURINE, GLUCOSEU, HGBUR, BILIRUBINUR, KETONESUR, PROTEINUR, UROBILINOGEN, NITRITE, LEUKOCYTESUR in the last 72 hours.  Invalid input(s): APPERANCEUR    Imaging: Dg Chest Port 1 View  Result Date: 06/11/2018 CLINICAL DATA:  Fever. History of seizure disorder, lobar pneumonia, chronic systolic heart failure, acute on chronic respiratory failure with hypoxia. EXAM: PORTABLE CHEST 1 VIEW COMPARISON:  Chest x-rays dated 05/11/2018 and 05/04/2018. FINDINGS: Stable cardiomegaly. RIGHT IJ dialysis catheter is well position with tip overlying the RIGHT atrium. Crowding of the bronchovascular structures at the RIGHT lung base. Lungs otherwise clear. No confluent opacity to suggest a developing pneumonia. No evidence of pulmonary edema. No pleural effusion or  pneumothorax seen. Osseous structures about the chest are unremarkable. IMPRESSION: 1. No evidence of acute cardiopulmonary abnormality. No evidence of pneumonia or pulmonary edema. 2. Stable cardiomegaly. Electronically Signed   By: Franki Cabot M.D.   On: 06/11/2018 16:50     Medications:       Assessment/ Plan:  73 y.o. male with a PMHx of COPD, end-stage renal disease, hypertension, hepatitis C, cirrhosis, chronic systolic heart failure, depression, anemia of chronic kidney disease, secondary hyperparathyroidism, seizure disorder, who was admitted to [facility] on 05/03/2018 for ongoing treatment of respiratory failure and end-stage renal disease.   1.  ESRD on HD.    Patient completed dialysis today.  Next dialysis treatment scheduled for Wednesday.  2.  Acute respiratory failure.    Continues to breathe quite comfortably off of the ventilator.  3.  Anemia of chronic kidney disease.    Hemoglobin currently 10.1 and at target.  Repeat CBC on Wednesday.  4.  Secondary hyperparathyroidism.    Phosphorus up to 8.5.  The patient remains on Renvela powder 0.8 g 3 times daily.  We will increase this to 2.4 g.   LOS: 0 Tyrice Hewitt 4/13/20203:30 PM

## 2018-06-14 LAB — RENAL FUNCTION PANEL
Albumin: 2.7 g/dL — ABNORMAL LOW (ref 3.5–5.0)
Anion gap: 18 — ABNORMAL HIGH (ref 5–15)
BUN: 66 mg/dL — ABNORMAL HIGH (ref 8–23)
CO2: 24 mmol/L (ref 22–32)
Calcium: 9.8 mg/dL (ref 8.9–10.3)
Chloride: 95 mmol/L — ABNORMAL LOW (ref 98–111)
Creatinine, Ser: 8.98 mg/dL — ABNORMAL HIGH (ref 0.61–1.24)
GFR calc Af Amer: 6 mL/min — ABNORMAL LOW (ref >60)
GFR calc non Af Amer: 5 mL/min — ABNORMAL LOW (ref >60)
Glucose, Bld: 86 mg/dL (ref 70–99)
Phosphorus: 6.7 mg/dL — ABNORMAL HIGH (ref 2.5–4.6)
Potassium: 4 mmol/L (ref 3.5–5.1)
Sodium: 137 mmol/L (ref 135–145)

## 2018-06-14 LAB — CBC
HCT: 32.7 % — ABNORMAL LOW (ref 39.0–52.0)
Hemoglobin: 10.4 g/dL — ABNORMAL LOW (ref 13.0–17.0)
MCH: 31.7 pg (ref 26.0–34.0)
MCHC: 31.8 g/dL (ref 30.0–36.0)
MCV: 99.7 fL (ref 80.0–100.0)
Platelets: 173 10*3/uL (ref 150–400)
RBC: 3.28 MIL/uL — ABNORMAL LOW (ref 4.22–5.81)
RDW: 16.7 % — ABNORMAL HIGH (ref 11.5–15.5)
WBC: 8.2 10*3/uL (ref 4.0–10.5)
nRBC: 0 % (ref 0.0–0.2)

## 2018-06-14 NOTE — Progress Notes (Signed)
[facility]  ROUNDING NOTE   Subjective:  Patient completed hemodialysis today. Tolerated well. Sitting up in bed.   Objective:  Vital signs in last 24 hours:  Temperature 97.6 pulse 88 respirations 18 blood pressure 156/86 Physical Exam: General: No acute distress  Head: [place]/AT hearing intact  Eyes: Anicteric  Neck: Supple, trachea midline  Lungs:  Scattered rhonchi, normal effort  Heart: S1S2 no rubs  Abdomen:  Soft, nontender, bowel sounds present  Extremities: trace peripheral edema.  Right arm swelling.  Neurologic: Awake, alert  Skin: No lesions  Access: R IJ permcath    Basic Metabolic Panel: Recent Labs  Lab 06/08/18 0617 06/08/18 1118 06/09/18 0657 06/12/18 0734 06/14/18 0811  NA 134*  --  137 135 137  K 6.4* 5.3* 4.7 3.6 4.0  CL 95*  --  96* 90* 95*  CO2 25  --  25 24 24   GLUCOSE 79  --  85 96 86  BUN 24*  --  43* 72* 66*  CREATININE 5.76*  --  7.76* 10.42* 8.98*  CALCIUM 9.6  --  9.2 9.6 9.8  PHOS  --   --  7.6* 8.5* 6.7*    Liver Function Tests: Recent Labs  Lab 06/09/18 0657 06/12/18 0734 06/14/18 0811  ALBUMIN 3.1* 2.8* 2.7*   No results for input(s): LIPASE, AMYLASE in the last 168 hours. No results for input(s): AMMONIA in the last 168 hours.  CBC: Recent Labs  Lab 06/09/18 0657 06/12/18 0734 06/14/18 0811  WBC 4.0 8.5 8.2  HGB 10.7* 10.1* 10.4*  HCT 34.9* 31.6* 32.7*  MCV 100.0 100.0 99.7  PLT 116* 144* 173    Cardiac Enzymes: No results for input(s): CKTOTAL, CKMB, CKMBINDEX, TROPONINI in the last 168 hours.  BNP: Invalid input(s): POCBNP  CBG: No results for input(s): GLUCAP in the last 168 hours.  Microbiology: Results for orders placed or performed during the hospital encounter of 05/03/18  C difficile quick scan w PCR reflex     Status: Abnormal   Collection Time: 05/03/18  7:40 PM  Result Value Ref Range Status   C Diff antigen POSITIVE (A) NEGATIVE Final   C Diff toxin NEGATIVE NEGATIVE Final   C  Diff interpretation Results are indeterminate. See PCR results.  Final    Comment: Performed at [facility], [place] [address] [place], [place] [address]  C. Diff by PCR, Reflexed     Status: Abnormal   Collection Time: 05/03/18  7:40 PM  Result Value Ref Range Status   Toxigenic C. Difficile by PCR POSITIVE (A) NEGATIVE Final    Comment: Positive for toxigenic C. difficile with little to no toxin production. Only treat if clinical presentation suggests symptomatic illness. Performed at [facility], [place] [address] [place], [place] [address]   Expectorated sputum assessment w rflx to resp cult     Status: None   Collection Time: 05/04/18  9:21 AM  Result Value Ref Range Status   Specimen Description EXPECTORATED SPUTUM  Final   Special Requests NONE  Final   Sputum evaluation   Final    THIS SPECIMEN IS ACCEPTABLE FOR SPUTUM CULTURE Performed at [facility], [place] [address], [place],  [address]    Report Status 05/04/2018 FINAL  Final  Culture, respiratory     Status: None   Collection Time: 05/04/18  9:21 AM  Result Value Ref Range Status   Specimen Description EXPECTORATED SPUTUM  Final   Special Requests  NONE Reflexed from [id number]  Final   Gram Stain   Final    ABUNDANT WBC PRESENT, PREDOMINANTLY PMN RARE SQUAMOUS EPITHELIAL CELLS PRESENT NO ORGANISMS SEEN    Culture   Final    FEW Consistent with normal respiratory flora. Performed at [facility], [place] [address], [place], [place] [address]    Report Status 05/06/2018 FINAL  Final  Culture, blood (routine x 2)     Status: None   Collection Time: 05/11/18  1:40 PM  Result Value Ref Range Status   Specimen Description BLOOD RIGHT HAND  Final   Special Requests   Final    BOTTLES DRAWN AEROBIC ONLY Blood Culture results may not be optimal due to an inadequate volume of blood received in culture bottles   Culture   Final    NO GROWTH 5 DAYS Performed at [facility], [place] [address]  [place], [place] [address]    Report Status 05/16/2018 FINAL  Final  Culture, blood (routine x 2)     Status: None   Collection Time: 05/11/18  1:45 PM  Result Value Ref Range Status   Specimen Description BLOOD RIGHT HAND  Final   Special Requests   Final    BOTTLES DRAWN AEROBIC ONLY Blood Culture results may not be optimal due to an inadequate volume of blood received in culture bottles   Culture   Final    NO GROWTH 5 DAYS Performed at [facility], [place] [address], [place], [place] [address]    Report Status 05/16/2018 FINAL  Final  Culture, respiratory     Status: None   Collection Time: 05/11/18  4:20 PM  Result Value Ref Range Status   Specimen Description SPUTUM  Final   Special Requests NONE  Final   Gram Stain   Final    ABUNDANT WBC PRESENT,BOTH PMN AND MONONUCLEAR NO ORGANISMS SEEN Performed at [facility], [address] [place], [place] [address]    Culture FEW ENTEROCOCCUS FAECALIS  Final   Report Status 05/13/2018 FINAL  Final   Organism ID, Bacteria ENTEROCOCCUS FAECALIS  Final      Susceptibility   Enterococcus faecalis - MIC*    AMPICILLIN <=2 SENSITIVE Sensitive     VANCOMYCIN 2 SENSITIVE Sensitive     GENTAMICIN SYNERGY SENSITIVE Sensitive     * FEW ENTEROCOCCUS FAECALIS  C difficile quick scan w PCR reflex     Status: Abnormal   Collection Time: 05/21/18 11:44 AM  Result Value Ref Range Status   C Diff antigen POSITIVE (A) NEGATIVE Final   C Diff toxin NEGATIVE NEGATIVE Final   C Diff interpretation Results are indeterminate. See PCR results.  Final    Comment: Performed at [facility], [place] [address], [place], [place] [address]  C. Diff by PCR, Reflexed     Status: Abnormal   Collection Time: 05/21/18 11:44 AM  Result Value Ref Range Status   Toxigenic C. Difficile by PCR POSITIVE (A) NEGATIVE Final    Comment: Positive for toxigenic C. difficile with little to no toxin production. Only treat if clinical presentation suggests  symptomatic illness. Performed at [facility], [place] [address] [place],  [address]   Culture, blood (routine x 2)     Status: None (Preliminary result)   Collection Time: 06/11/18  2:51 PM  Result Value Ref Range Status   Specimen Description BLOOD RIGHT HAND  Final   Special Requests  Final    BOTTLES DRAWN AEROBIC ONLY Blood Culture adequate volume   Culture   Final    NO GROWTH 3 DAYS Performed at [facility], [place] [address], [place], [place] [address]    Report Status PENDING  Incomplete  Culture, blood (routine x 2)     Status: None (Preliminary result)   Collection Time: 06/11/18  2:51 PM  Result Value Ref Range Status   Specimen Description BLOOD WRIST RIGHT  Final   Special Requests   Final    BOTTLES DRAWN AEROBIC ONLY Blood Culture adequate volume   Culture   Final    NO GROWTH 3 DAYS Performed at [facility], [address], [place], [place] [address]    Report Status PENDING  Incomplete    Coagulation Studies: No results for input(s): LABPROT, INR in the last 72 hours.  Urinalysis: No results for input(s): COLORURINE, LABSPEC, PHURINE, GLUCOSEU, HGBUR, BILIRUBINUR, KETONESUR, PROTEINUR, UROBILINOGEN, NITRITE, LEUKOCYTESUR in the last 72 hours.  Invalid input(s): APPERANCEUR    Imaging: No results found.   Medications:       Assessment/ Plan:  73 y.o. male with a PMHx of COPD, end-stage renal disease, hypertension, hepatitis C, cirrhosis, chronic systolic heart failure, depression, anemia of chronic kidney disease, secondary hyperparathyroidism, seizure disorder, who was admitted to [facility] on 05/03/2018 for ongoing treatment of respiratory failure and end-stage renal disease.   1.  ESRD on HD.    Patient completed dialysis treatment today.  Next treatment scheduled for Friday.  We will prepare orders.  2.  Acute respiratory failure.    Patient has done well post extubation.  3.  Anemia of chronic kidney disease.     Hemoglobin currently 10.4 and at target.  Repeat CBC again on Friday.  4.  Secondary hyperparathyroidism.    Phosphorus down to 6.7 and per increasing Renvela to 2.4 g p.o. 3 times daily.   LOS: 0 Eoin Willden 4/15/20203:38 PM

## 2018-06-16 LAB — RENAL FUNCTION PANEL
Albumin: 2.9 g/dL — ABNORMAL LOW (ref 3.5–5.0)
Anion gap: 18 — ABNORMAL HIGH (ref 5–15)
BUN: 58 mg/dL — ABNORMAL HIGH (ref 8–23)
CO2: 23 mmol/L (ref 22–32)
Calcium: 9.7 mg/dL (ref 8.9–10.3)
Chloride: 95 mmol/L — ABNORMAL LOW (ref 98–111)
Creatinine, Ser: 8.23 mg/dL — ABNORMAL HIGH (ref 0.61–1.24)
GFR calc Af Amer: 7 mL/min — ABNORMAL LOW (ref >60)
GFR calc non Af Amer: 6 mL/min — ABNORMAL LOW (ref >60)
Glucose, Bld: 112 mg/dL — ABNORMAL HIGH (ref 70–99)
Phosphorus: 5.5 mg/dL — ABNORMAL HIGH (ref 2.5–4.6)
Potassium: 4.2 mmol/L (ref 3.5–5.1)
Sodium: 136 mmol/L (ref 135–145)

## 2018-06-16 LAB — CULTURE, BLOOD (ROUTINE X 2)
Culture: NO GROWTH
Culture: NO GROWTH
Special Requests: ADEQUATE
Special Requests: ADEQUATE

## 2018-06-16 LAB — CBC
HCT: 34.9 % — ABNORMAL LOW (ref 39.0–52.0)
Hemoglobin: 10.8 g/dL — ABNORMAL LOW (ref 13.0–17.0)
MCH: 30.3 pg (ref 26.0–34.0)
MCHC: 30.9 g/dL (ref 30.0–36.0)
MCV: 97.8 fL (ref 80.0–100.0)
Platelets: 181 10*3/uL (ref 150–400)
RBC: 3.57 MIL/uL — ABNORMAL LOW (ref 4.22–5.81)
RDW: 16.5 % — ABNORMAL HIGH (ref 11.5–15.5)
WBC: 7.4 10*3/uL (ref 4.0–10.5)
nRBC: 0 % (ref 0.0–0.2)

## 2018-06-16 NOTE — Progress Notes (Signed)
[facility]  ROUNDING NOTE   Subjective:  Patient completed dialysis today. Eating lunch at the moment.   Objective:  Vital signs in last 24 hours:  Temperature 97.5 pulse 92 respirations 19 blood pressure 101/52   Physical Exam: General: No acute distress  Head: [place]/AT hearing intact  Eyes: Anicteric  Neck: Supple, trachea midline  Lungs:  Scattered rhonchi, normal effort  Heart: S1S2 no rubs  Abdomen:  Soft, nontender, bowel sounds present  Extremities: trace peripheral edema.  Right arm swelling.  Neurologic: Awake, alert  Skin: No lesions  Access: R IJ permcath    Basic Metabolic Panel: Recent Labs  Lab 06/12/18 0734 06/14/18 0811 06/16/18 0616  NA 135 137 136  K 3.6 4.0 4.2  CL 90* 95* 95*  CO2 24 24 23   GLUCOSE 96 86 112*  BUN 72* 66* 58*  CREATININE 10.42* 8.98* 8.23*  CALCIUM 9.6 9.8 9.7  PHOS 8.5* 6.7* 5.5*    Liver Function Tests: Recent Labs  Lab 06/12/18 0734 06/14/18 0811 06/16/18 0616  ALBUMIN 2.8* 2.7* 2.9*   No results for input(s): LIPASE, AMYLASE in the last 168 hours. No results for input(s): AMMONIA in the last 168 hours.  CBC: Recent Labs  Lab 06/12/18 0734 06/14/18 0811 06/16/18 0616  WBC 8.5 8.2 7.4  HGB 10.1* 10.4* 10.8*  HCT 31.6* 32.7* 34.9*  MCV 100.0 99.7 97.8  PLT 144* 173 181    Cardiac Enzymes: No results for input(s): CKTOTAL, CKMB, CKMBINDEX, TROPONINI in the last 168 hours.  BNP: Invalid input(s): POCBNP  CBG: No results for input(s): GLUCAP in the last 168 hours.  Microbiology: Results for orders placed or performed during the hospital encounter of 05/03/18  C difficile quick scan w PCR reflex     Status: Abnormal   Collection Time: 05/03/18  7:40 PM  Result Value Ref Range Status   C Diff antigen POSITIVE (A) NEGATIVE Final   C Diff toxin NEGATIVE NEGATIVE Final   C Diff interpretation Results are indeterminate. See PCR results.  Final    Comment: Performed at [facility],  [place] [address], [place], [place] [address]  C. Diff by PCR, Reflexed     Status: Abnormal   Collection Time: 05/03/18  7:40 PM  Result Value Ref Range Status   Toxigenic C. Difficile by PCR POSITIVE (A) NEGATIVE Final    Comment: Positive for toxigenic C. difficile with little to no toxin production. Only treat if clinical presentation suggests symptomatic illness. Performed at [facility], [place] [address], [place], [place] [address]   Expectorated sputum assessment w rflx to resp cult     Status: None   Collection Time: 05/04/18  9:21 AM  Result Value Ref Range Status   Specimen Description EXPECTORATED SPUTUM  Final   Special Requests NONE  Final   Sputum evaluation   Final    THIS SPECIMEN IS ACCEPTABLE FOR SPUTUM CULTURE Performed at [facility], [place] [address], [place], [place] [address]    Report Status 05/04/2018 FINAL  Final  Culture, respiratory     Status: None   Collection Time: 05/04/18  9:21 AM  Result Value Ref Range Status   Specimen Description EXPECTORATED SPUTUM  Final   Special Requests NONE Reflexed from [id number]  Final   Gram Stain   Final    ABUNDANT WBC PRESENT, PREDOMINANTLY PMN RARE SQUAMOUS EPITHELIAL CELLS PRESENT NO ORGANISMS SEEN    Culture   Final  FEW Consistent with normal respiratory flora. Performed at [facility], [place] [address], [place], [place] [address]    Report Status 05/06/2018 FINAL  Final  Culture, blood (routine x 2)     Status: None   Collection Time: 05/11/18  1:40 PM  Result Value Ref Range Status   Specimen Description BLOOD RIGHT HAND  Final   Special Requests   Final    BOTTLES DRAWN AEROBIC ONLY Blood Culture results may not be optimal due to an inadequate volume of blood received in culture bottles   Culture   Final    NO GROWTH 5 DAYS Performed at [facility], [place] [address] [place], [place] [address]    Report Status 05/16/2018 FINAL  Final  Culture, blood (routine x 2)     Status: None    Collection Time: 05/11/18  1:45 PM  Result Value Ref Range Status   Specimen Description BLOOD RIGHT HAND  Final   Special Requests   Final    BOTTLES DRAWN AEROBIC ONLY Blood Culture results may not be optimal due to an inadequate volume of blood received in culture bottles   Culture   Final    NO GROWTH 5 DAYS Performed at [facility], [place] [address] [place], [place] [address]    Report Status 05/16/2018 FINAL  Final  Culture, respiratory     Status: None   Collection Time: 05/11/18  4:20 PM  Result Value Ref Range Status   Specimen Description SPUTUM  Final   Special Requests NONE  Final   Gram Stain   Final    ABUNDANT WBC PRESENT,BOTH PMN AND MONONUCLEAR NO ORGANISMS SEEN Performed at [facility], [address] [place], [place] [address]    Culture FEW ENTEROCOCCUS FAECALIS  Final   Report Status 05/13/2018 FINAL  Final   Organism ID, Bacteria ENTEROCOCCUS FAECALIS  Final      Susceptibility   Enterococcus faecalis - MIC*    AMPICILLIN <=2 SENSITIVE Sensitive     VANCOMYCIN 2 SENSITIVE Sensitive     GENTAMICIN SYNERGY SENSITIVE Sensitive     * FEW ENTEROCOCCUS FAECALIS  C difficile quick scan w PCR reflex     Status: Abnormal   Collection Time: 05/21/18 11:44 AM  Result Value Ref Range Status   C Diff antigen POSITIVE (A) NEGATIVE Final   C Diff toxin NEGATIVE NEGATIVE Final   C Diff interpretation Results are indeterminate. See PCR results.  Final    Comment: Performed at [facility], [place] [address], [place], [place] [address]  C. Diff by PCR, Reflexed     Status: Abnormal   Collection Time: 05/21/18 11:44 AM  Result Value Ref Range Status   Toxigenic C. Difficile by PCR POSITIVE (A) NEGATIVE Final    Comment: Positive for toxigenic C. difficile with little to no toxin production. Only treat if clinical presentation suggests symptomatic illness. Performed at [facility], [place] [address], [place], [place] [address]   Culture, blood  (routine x 2)     Status: None   Collection Time: 06/11/18  2:51 PM  Result Value Ref Range Status   Specimen Description BLOOD RIGHT HAND  Final   Special Requests   Final    BOTTLES DRAWN AEROBIC ONLY Blood Culture adequate volume   Culture   Final    NO GROWTH 5 DAYS Performed at [facility], [place] [address] [place], [place] [address]  Report Status 06/16/2018 FINAL  Final  Culture, blood (routine x 2)     Status: None   Collection Time: 06/11/18  2:51 PM  Result Value Ref Range Status   Specimen Description BLOOD WRIST RIGHT  Final   Special Requests   Final    BOTTLES DRAWN AEROBIC ONLY Blood Culture adequate volume   Culture   Final    NO GROWTH 5 DAYS Performed at [facility], [address], [place], [place] [address]    Report Status 06/16/2018 FINAL  Final    Coagulation Studies: No results for input(s): LABPROT, INR in the last 72 hours.  Urinalysis: No results for input(s): COLORURINE, LABSPEC, PHURINE, GLUCOSEU, HGBUR, BILIRUBINUR, KETONESUR, PROTEINUR, UROBILINOGEN, NITRITE, LEUKOCYTESUR in the last 72 hours.  Invalid input(s): APPERANCEUR    Imaging: No results found.   Medications:       Assessment/ Plan:  73 y.o. male with a PMHx of COPD, end-stage renal disease, hypertension, hepatitis C, cirrhosis, chronic systolic heart failure, depression, anemia of chronic kidney disease, secondary hyperparathyroidism, seizure disorder, who was admitted to [facility] on 05/03/2018 for ongoing treatment of respiratory failure and end-stage renal disease.   1.  ESRD on HD.    Patient completed dialysis today.  Next Alysis treatment for Monday.  2.  Acute respiratory failure.    Patient continues to be breathing comfortably and is done well off of the ventilator.  3.  Anemia of chronic kidney disease.    Hemoglobin at target at 10.8.  Repeat on Monday.  4.  Secondary hyperparathyroidism.    Phosphorus down to 5.5.  Maintain the patient on Renvela  2.4 g p.o. 3 times daily.   LOS: 0 Terrina Docter 4/17/20203:27 PM

## 2018-06-19 LAB — RENAL FUNCTION PANEL
Albumin: 2.8 g/dL — ABNORMAL LOW (ref 3.5–5.0)
Anion gap: 21 — ABNORMAL HIGH (ref 5–15)
BUN: 82 mg/dL — ABNORMAL HIGH (ref 8–23)
CO2: 18 mmol/L — ABNORMAL LOW (ref 22–32)
Calcium: 9.8 mg/dL (ref 8.9–10.3)
Chloride: 95 mmol/L — ABNORMAL LOW (ref 98–111)
Creatinine, Ser: 9.88 mg/dL — ABNORMAL HIGH (ref 0.61–1.24)
GFR calc Af Amer: 5 mL/min — ABNORMAL LOW (ref >60)
GFR calc non Af Amer: 5 mL/min — ABNORMAL LOW (ref >60)
Glucose, Bld: 55 mg/dL — ABNORMAL LOW (ref 70–99)
Phosphorus: 6.5 mg/dL — ABNORMAL HIGH (ref 2.5–4.6)
Potassium: 5.6 mmol/L — ABNORMAL HIGH (ref 3.5–5.1)
Sodium: 134 mmol/L — ABNORMAL LOW (ref 135–145)

## 2018-06-19 LAB — CBC
HCT: 31.3 % — ABNORMAL LOW (ref 39.0–52.0)
Hemoglobin: 10 g/dL — ABNORMAL LOW (ref 13.0–17.0)
MCH: 30.1 pg (ref 26.0–34.0)
MCHC: 31.9 g/dL (ref 30.0–36.0)
MCV: 94.3 fL (ref 80.0–100.0)
Platelets: 211 10*3/uL (ref 150–400)
RBC: 3.32 MIL/uL — ABNORMAL LOW (ref 4.22–5.81)
RDW: 16.6 % — ABNORMAL HIGH (ref 11.5–15.5)
WBC: 7.5 10*3/uL (ref 4.0–10.5)
nRBC: 0 % (ref 0.0–0.2)

## 2018-06-19 NOTE — Progress Notes (Signed)
[facility]  ROUNDING NOTE   Subjective:  Patient seen and evaluated during hemodialysis.  Tolerating well. Sitting comfortably in a chair.   Objective:  Vital signs in last 24 hours:  HR 97.8 pulse 92 respirations 20 blood pressure 160/78   Physical Exam: General: No acute distress  Head: [place]/AT hearing intact  Eyes: Anicteric  Neck: Supple, trachea midline  Lungs:  Scattered rhonchi, normal effort  Heart: S1S2 no rubs  Abdomen:  Soft, nontender, bowel sounds present  Extremities: trace peripheral edema.  Right arm swelling.  Neurologic: Awake, alert  Skin: No lesions  Access: R IJ permcath    Basic Metabolic Panel: Recent Labs  Lab 06/14/18 0811 06/16/18 0616  NA 137 136  K 4.0 4.2  CL 95* 95*  CO2 24 23  GLUCOSE 86 112*  BUN 66* 58*  CREATININE 8.98* 8.23*  CALCIUM 9.8 9.7  PHOS 6.7* 5.5*    Liver Function Tests: Recent Labs  Lab 06/14/18 0811 06/16/18 0616  ALBUMIN 2.7* 2.9*   No results for input(s): LIPASE, AMYLASE in the last 168 hours. No results for input(s): AMMONIA in the last 168 hours.  CBC: Recent Labs  Lab 06/14/18 0811 06/16/18 0616  WBC 8.2 7.4  HGB 10.4* 10.8*  HCT 32.7* 34.9*  MCV 99.7 97.8  PLT 173 181    Cardiac Enzymes: No results for input(s): CKTOTAL, CKMB, CKMBINDEX, TROPONINI in the last 168 hours.  BNP: Invalid input(s): POCBNP  CBG: No results for input(s): GLUCAP in the last 168 hours.  Microbiology: Results for orders placed or performed during the hospital encounter of 05/03/18  C difficile quick scan w PCR reflex     Status: Abnormal   Collection Time: 05/03/18  7:40 PM  Result Value Ref Range Status   C Diff antigen POSITIVE (A) NEGATIVE Final   C Diff toxin NEGATIVE NEGATIVE Final   C Diff interpretation Results are indeterminate. See PCR results.  Final    Comment: Performed at [facility], [place] [address], [place], [place] [address]  C. Diff by PCR, Reflexed     Status: Abnormal    Collection Time: 05/03/18  7:40 PM  Result Value Ref Range Status   Toxigenic C. Difficile by PCR POSITIVE (A) NEGATIVE Final    Comment: Positive for toxigenic C. difficile with little to no toxin production. Only treat if clinical presentation suggests symptomatic illness. Performed at [facility], [place] [address] [place], [place] [address]   Expectorated sputum assessment w rflx to resp cult     Status: None   Collection Time: 05/04/18  9:21 AM  Result Value Ref Range Status   Specimen Description EXPECTORATED SPUTUM  Final   Special Requests NONE  Final   Sputum evaluation   Final    THIS SPECIMEN IS ACCEPTABLE FOR SPUTUM CULTURE Performed at [facility], [place] [address], [place], [place] [address]    Report Status 05/04/2018 FINAL  Final  Culture, respiratory     Status: None   Collection Time: 05/04/18  9:21 AM  Result Value Ref Range Status   Specimen Description EXPECTORATED SPUTUM  Final   Special Requests NONE Reflexed from [id number]  Final   Gram Stain   Final    ABUNDANT WBC PRESENT, PREDOMINANTLY PMN RARE SQUAMOUS EPITHELIAL CELLS PRESENT NO ORGANISMS SEEN    Culture   Final    FEW Consistent with normal respiratory flora. Performed at [facility], [place] [address]  [place], [place] [address]    Report Status 05/06/2018 FINAL  Final  Culture, blood (routine x 2)     Status: None   Collection Time: 05/11/18  1:40 PM  Result Value Ref Range Status   Specimen Description BLOOD RIGHT HAND  Final   Special Requests   Final    BOTTLES DRAWN AEROBIC ONLY Blood Culture results may not be optimal due to an inadequate volume of blood received in culture bottles   Culture   Final    NO GROWTH 5 DAYS Performed at [facility], [place] [address] [place], [place] [address]    Report Status 05/16/2018 FINAL  Final  Culture, blood (routine x 2)     Status: None   Collection Time: 05/11/18  1:45 PM  Result Value Ref Range Status   Specimen Description  BLOOD RIGHT HAND  Final   Special Requests   Final    BOTTLES DRAWN AEROBIC ONLY Blood Culture results may not be optimal due to an inadequate volume of blood received in culture bottles   Culture   Final    NO GROWTH 5 DAYS Performed at [facility], [place] [address] [place], [place] [address]    Report Status 05/16/2018 FINAL  Final  Culture, respiratory     Status: None   Collection Time: 05/11/18  4:20 PM  Result Value Ref Range Status   Specimen Description SPUTUM  Final   Special Requests NONE  Final   Gram Stain   Final    ABUNDANT WBC PRESENT,BOTH PMN AND MONONUCLEAR NO ORGANISMS SEEN Performed at [facility], [address] [place], [place] [address]    Culture FEW ENTEROCOCCUS FAECALIS  Final   Report Status 05/13/2018 FINAL  Final   Organism ID, Bacteria ENTEROCOCCUS FAECALIS  Final      Susceptibility   Enterococcus faecalis - MIC*    AMPICILLIN <=2 SENSITIVE Sensitive     VANCOMYCIN 2 SENSITIVE Sensitive     GENTAMICIN SYNERGY SENSITIVE Sensitive     * FEW ENTEROCOCCUS FAECALIS  C difficile quick scan w PCR reflex     Status: Abnormal   Collection Time: 05/21/18 11:44 AM  Result Value Ref Range Status   C Diff antigen POSITIVE (A) NEGATIVE Final   C Diff toxin NEGATIVE NEGATIVE Final   C Diff interpretation Results are indeterminate. See PCR results.  Final    Comment: Performed at [facility], [place] [address], [place], [place] [address]  C. Diff by PCR, Reflexed     Status: Abnormal   Collection Time: 05/21/18 11:44 AM  Result Value Ref Range Status   Toxigenic C. Difficile by PCR POSITIVE (A) NEGATIVE Final    Comment: Positive for toxigenic C. difficile with little to no toxin production. Only treat if clinical presentation suggests symptomatic illness. Performed at [facility], [place] [address] [place], [place] [address]   Culture, blood (routine x 2)     Status: None   Collection Time: 06/11/18  2:51 PM  Result Value Ref Range  Status   Specimen Description BLOOD RIGHT HAND  Final   Special Requests   Final    BOTTLES DRAWN AEROBIC ONLY Blood Culture adequate volume   Culture   Final    NO GROWTH 5 DAYS Performed at [facility], [place] [address], [place],  [address]    Report Status 06/16/2018 FINAL  Final  Culture, blood (routine x 2)  Status: None   Collection Time: 06/11/18  2:51 PM  Result Value Ref Range Status   Specimen Description BLOOD WRIST RIGHT  Final   Special Requests   Final    BOTTLES DRAWN AEROBIC ONLY Blood Culture adequate volume   Culture   Final    NO GROWTH 5 DAYS Performed at [facility], [address] [place], [place] [address]    Report Status 06/16/2018 FINAL  Final    Coagulation Studies: No results for input(s): LABPROT, INR in the last 72 hours.  Urinalysis: No results for input(s): COLORURINE, LABSPEC, PHURINE, GLUCOSEU, HGBUR, BILIRUBINUR, KETONESUR, PROTEINUR, UROBILINOGEN, NITRITE, LEUKOCYTESUR in the last 72 hours.  Invalid input(s): APPERANCEUR    Imaging: No results found.   Medications:       Assessment/ Plan:  72 y.o. male with a PMHx of COPD, end-stage renal disease, hypertension, hepatitis C, cirrhosis, chronic systolic heart failure, depression, anemia of chronic kidney disease, secondary hyperparathyroidism, seizure disorder, who was admitted to [facility] on [facility]/03/2018 for ongoing treatment of respiratory failure and end-stage renal disease.   1.  ESRD on HD.    Patient seen and evaluated during hemodialysis.  Tolerating well.  We plan to complete dialysis treatment today.  He will start outpatient dialysis going forward.  2.  Acute respiratory failure.    Now resolved.  Patient did well post extubation.  3.  Anemia of chronic kidney disease.    Hemoglobin remains at target at 10.8.  Continue to periodically monitor as an outpatient.  4.  Secondary hyperparathyroidism.    Most recent serum phosphorus was at target at 5.5.   Maintain the patient on Renvela powder.   LOS: 0 Floetta Brickey 4/20/20208:44 AM

## 2018-06-20 LAB — POTASSIUM: Potassium: 5.5 mmol/L — ABNORMAL HIGH (ref 3.5–5.1)

## 2018-06-21 LAB — CBC
HCT: 31.6 % — ABNORMAL LOW (ref 39.0–52.0)
Hemoglobin: 10.3 g/dL — ABNORMAL LOW (ref 13.0–17.0)
MCH: 30.7 pg (ref 26.0–34.0)
MCHC: 32.6 g/dL (ref 30.0–36.0)
MCV: 94 fL (ref 80.0–100.0)
Platelets: 206 10*3/uL (ref 150–400)
RBC: 3.36 MIL/uL — ABNORMAL LOW (ref 4.22–5.81)
RDW: 17 % — ABNORMAL HIGH (ref 11.5–15.5)
WBC: 7.7 10*3/uL (ref 4.0–10.5)
nRBC: 0 % (ref 0.0–0.2)

## 2018-06-21 LAB — RENAL FUNCTION PANEL
Albumin: 2.7 g/dL — ABNORMAL LOW (ref 3.5–5.0)
Anion gap: 21 — ABNORMAL HIGH (ref 5–15)
BUN: 89 mg/dL — ABNORMAL HIGH (ref 8–23)
CO2: 21 mmol/L — ABNORMAL LOW (ref 22–32)
Calcium: 9.9 mg/dL (ref 8.9–10.3)
Chloride: 95 mmol/L — ABNORMAL LOW (ref 98–111)
Creatinine, Ser: 9.61 mg/dL — ABNORMAL HIGH (ref 0.61–1.24)
GFR calc Af Amer: 6 mL/min — ABNORMAL LOW (ref >60)
GFR calc non Af Amer: 5 mL/min — ABNORMAL LOW (ref >60)
Glucose, Bld: 97 mg/dL (ref 70–99)
Phosphorus: 6.8 mg/dL — ABNORMAL HIGH (ref 2.5–4.6)
Potassium: 5.2 mmol/L — ABNORMAL HIGH (ref 3.5–5.1)
Sodium: 137 mmol/L (ref 135–145)

## 2018-06-21 NOTE — Progress Notes (Signed)
[facility]  ROUNDING NOTE   Subjective:  Patient completed dialysis today. Resting comfortably in bed at the moment.  Objective:  Vital signs in last 24 hours:  Temperature 98.5 pulse 91 respirations 18 blood pressure 142/60   Physical Exam: General: No acute distress  Head: [place]/AT hearing intact  Eyes: Anicteric  Neck: Supple, trachea midline  Lungs:  Scattered rhonchi, normal effort  Heart: S1S2 no rubs  Abdomen:  Soft, nontender, bowel sounds present  Extremities: trace peripheral edema.  Right arm swelling.  Neurologic: Awake, alert  Skin: No lesions  Access: R IJ permcath    Basic Metabolic Panel: Recent Labs  Lab 06/16/18 0616 06/19/18 1010 06/20/18 1332 06/21/18 0510  NA 136 134*  --  137  K 4.2 5.6* 5.5* 5.2*  CL 95* 95*  --  95*  CO2 23 18*  --  21*  GLUCOSE 112* 55*  --  97  BUN 58* 82*  --  89*  CREATININE 8.23* 9.88*  --  9.61*  CALCIUM 9.7 9.8  --  9.9  PHOS 5.5* 6.5*  --  6.8*    Liver Function Tests: Recent Labs  Lab 06/16/18 0616 06/19/18 1010 06/21/18 0510  ALBUMIN 2.9* 2.8* 2.7*   No results for input(s): LIPASE, AMYLASE in the last 168 hours. No results for input(s): AMMONIA in the last 168 hours.  CBC: Recent Labs  Lab 06/16/18 0616 06/19/18 1010 06/21/18 0510  WBC 7.4 7.5 7.7  HGB 10.8* 10.0* 10.3*  HCT 34.9* 31.3* 31.6*  MCV 97.8 94.3 94.0  PLT 181 211 206    Cardiac Enzymes: No results for input(s): CKTOTAL, CKMB, CKMBINDEX, TROPONINI in the last 168 hours.  BNP: Invalid input(s): POCBNP  CBG: No results for input(s): GLUCAP in the last 168 hours.  Microbiology: Results for orders placed or performed during the hospital encounter of 05/03/18  C difficile quick scan w PCR reflex     Status: Abnormal   Collection Time: 05/03/18  7:40 PM  Result Value Ref Range Status   C Diff antigen POSITIVE (A) NEGATIVE Final   C Diff toxin NEGATIVE NEGATIVE Final   C Diff interpretation Results are indeterminate.  See PCR results.  Final    Comment: Performed at [facility], [place] [address], [place], [place] [address]  C. Diff by PCR, Reflexed     Status: Abnormal   Collection Time: 05/03/18  7:40 PM  Result Value Ref Range Status   Toxigenic C. Difficile by PCR POSITIVE (A) NEGATIVE Final    Comment: Positive for toxigenic C. difficile with little to no toxin production. Only treat if clinical presentation suggests symptomatic illness. Performed at [facility], [place] [address] [place], [place] [address]   Expectorated sputum assessment w rflx to resp cult     Status: None   Collection Time: 05/04/18  9:21 AM  Result Value Ref Range Status   Specimen Description EXPECTORATED SPUTUM  Final   Special Requests NONE  Final   Sputum evaluation   Final    THIS SPECIMEN IS ACCEPTABLE FOR SPUTUM CULTURE Performed at [facility], [place] [address] [place], [place] [address]    Report Status 05/04/2018 FINAL  Final  Culture, respiratory     Status: None   Collection Time: 05/04/18  9:21 AM  Result Value Ref Range Status   Specimen Description EXPECTORATED SPUTUM  Final   Special Requests NONE Reflexed from [id number]  Final   Gram Stain  Final    ABUNDANT WBC PRESENT, PREDOMINANTLY PMN RARE SQUAMOUS EPITHELIAL CELLS PRESENT NO ORGANISMS SEEN    Culture   Final    FEW Consistent with normal respiratory flora. Performed at [facility], [place] [address] [place], [place] [address]    Report Status 05/06/2018 FINAL  Final  Culture, blood (routine x 2)     Status: None   Collection Time: 05/11/18  1:40 PM  Result Value Ref Range Status   Specimen Description BLOOD RIGHT HAND  Final   Special Requests   Final    BOTTLES DRAWN AEROBIC ONLY Blood Culture results may not be optimal due to an inadequate volume of blood received in culture bottles   Culture   Final    NO GROWTH 5 DAYS Performed at [facility], [place] [address], [place], [place] [address]    Report Status  05/16/2018 FINAL  Final  Culture, blood (routine x 2)     Status: None   Collection Time: 05/11/18  1:45 PM  Result Value Ref Range Status   Specimen Description BLOOD RIGHT HAND  Final   Special Requests   Final    BOTTLES DRAWN AEROBIC ONLY Blood Culture results may not be optimal due to an inadequate volume of blood received in culture bottles   Culture   Final    NO GROWTH 5 DAYS Performed at [facility], [place] [address] [place], [place] [address]    Report Status 05/16/2018 FINAL  Final  Culture, respiratory     Status: None   Collection Time: 05/11/18  4:20 PM  Result Value Ref Range Status   Specimen Description SPUTUM  Final   Special Requests NONE  Final   Gram Stain   Final    ABUNDANT WBC PRESENT,BOTH PMN AND MONONUCLEAR NO ORGANISMS SEEN Performed at [facility], [address], [place], [place] [address]    Culture FEW ENTEROCOCCUS FAECALIS  Final   Report Status 05/13/2018 FINAL  Final   Organism ID, Bacteria ENTEROCOCCUS FAECALIS  Final      Susceptibility   Enterococcus faecalis - MIC*    AMPICILLIN <=2 SENSITIVE Sensitive     VANCOMYCIN 2 SENSITIVE Sensitive     GENTAMICIN SYNERGY SENSITIVE Sensitive     * FEW ENTEROCOCCUS FAECALIS  C difficile quick scan w PCR reflex     Status: Abnormal   Collection Time: 05/21/18 11:44 AM  Result Value Ref Range Status   C Diff antigen POSITIVE (A) NEGATIVE Final   C Diff toxin NEGATIVE NEGATIVE Final   C Diff interpretation Results are indeterminate. See PCR results.  Final    Comment: Performed at [facility], [place] [address], [place], [place] [address]  C. Diff by PCR, Reflexed     Status: Abnormal   Collection Time: 05/21/18 11:44 AM  Result Value Ref Range Status   Toxigenic C. Difficile by PCR POSITIVE (A) NEGATIVE Final    Comment: Positive for toxigenic C. difficile with little to no toxin production. Only treat if clinical presentation suggests symptomatic illness. Performed at [facility], [place] [address] [place], [place] [address]   Culture, blood (routine x 2)     Status: None   Collection Time: 06/11/18  2:51 PM  Result Value Ref Range Status   Specimen Description BLOOD RIGHT HAND  Final   Special Requests   Final    BOTTLES DRAWN AEROBIC ONLY Blood Culture adequate volume  Culture   Final    NO GROWTH 5 DAYS Performed at [facility], [place] [address], [place], [place] [address]    Report Status 06/16/2018 FINAL  Final  Culture, blood (routine x 2)     Status: None   Collection Time: 06/11/18  2:51 PM  Result Value Ref Range Status   Specimen Description BLOOD WRIST RIGHT  Final   Special Requests   Final    BOTTLES DRAWN AEROBIC ONLY Blood Culture adequate volume   Culture   Final    NO GROWTH 5 DAYS Performed at [facility], [address], [place],  [address]    Report Status 06/16/2018 FINAL  Final    Coagulation Studies: No results for input(s): LABPROT, INR in the last 72 hours.  Urinalysis: No results for input(s): COLORURINE, LABSPEC, PHURINE, GLUCOSEU, HGBUR, BILIRUBINUR, KETONESUR, PROTEINUR, UROBILINOGEN, NITRITE, LEUKOCYTESUR in the last 72 hours.  Invalid input(s): APPERANCEUR    Imaging: No results found.   Medications:       Assessment/ Plan:  73 y.o. male with a PMHx of COPD, end-stage renal disease, hypertension, hepatitis C, cirrhosis, chronic systolic heart failure, depression, anemia of chronic kidney disease, secondary hyperparathyroidism, seizure disorder, who was admitted to [facility] on 05/03/2018 for ongoing treatment of respiratory failure and end-stage renal disease.   1.  ESRD on HD.    Patient seen at bedside.  He completed dialysis today.  Patient will resume outpatient dialysis going forward.  2.  Acute respiratory failure.    Patient has done well post extubation.  He is breathing comfortably.  3.  Anemia of chronic kidney disease.    Hemoglobin 10.3 and at target.  Consider  erythropoietin stimulating agents as an outpatient.  4.  Secondary hyperparathyroidism.    Phosphorus has been fluctuating a bit.  Maintain the patient on Renvela 2.4 g p.o. 3 times daily with meals.   LOS: 0 Shanitha Twining 4/22/20203:10 PM

## 2019-04-16 ENCOUNTER — Inpatient Hospital Stay (HOSPITAL_COMMUNITY)
Admission: EM | Admit: 2019-04-16 | Discharge: 2019-04-24 | DRG: 377 | Disposition: A | Discharge status: Skilled Nursing Facility | Payer: Medicare Other | Source: Other Acute Inpatient Hospital | Attending: Internal Medicine | Admitting: Internal Medicine

## 2019-04-16 ENCOUNTER — Emergency Department (HOSPITAL_COMMUNITY): Payer: Medicare Other

## 2019-04-16 DIAGNOSIS — D62 Acute posthemorrhagic anemia: Secondary | ICD-10-CM | POA: Diagnosis present

## 2019-04-16 DIAGNOSIS — D539 Nutritional anemia, unspecified: Secondary | ICD-10-CM | POA: Diagnosis present

## 2019-04-16 DIAGNOSIS — G9341 Metabolic encephalopathy: Secondary | ICD-10-CM | POA: Diagnosis present

## 2019-04-16 DIAGNOSIS — Z888 Allergy status to other drugs, medicaments and biological substances status: Secondary | ICD-10-CM

## 2019-04-16 DIAGNOSIS — Z781 Physical restraint status: Secondary | ICD-10-CM | POA: Diagnosis not present

## 2019-04-16 DIAGNOSIS — J449 Chronic obstructive pulmonary disease, unspecified: Secondary | ICD-10-CM | POA: Diagnosis present

## 2019-04-16 DIAGNOSIS — Z66 Do not resuscitate: Secondary | ICD-10-CM | POA: Diagnosis not present

## 2019-04-16 DIAGNOSIS — K921 Melena: Secondary | ICD-10-CM | POA: Diagnosis present

## 2019-04-16 DIAGNOSIS — D649 Anemia, unspecified: Secondary | ICD-10-CM | POA: Insufficient documentation

## 2019-04-16 DIAGNOSIS — B182 Chronic viral hepatitis C: Secondary | ICD-10-CM | POA: Diagnosis present

## 2019-04-16 DIAGNOSIS — D631 Anemia in chronic kidney disease: Secondary | ICD-10-CM | POA: Diagnosis present

## 2019-04-16 DIAGNOSIS — K922 Gastrointestinal hemorrhage, unspecified: Secondary | ICD-10-CM

## 2019-04-16 DIAGNOSIS — I5032 Chronic diastolic (congestive) heart failure: Secondary | ICD-10-CM | POA: Diagnosis present

## 2019-04-16 DIAGNOSIS — Z20822 Contact with and (suspected) exposure to covid-19: Secondary | ICD-10-CM | POA: Diagnosis present

## 2019-04-16 DIAGNOSIS — F0391 Unspecified dementia with behavioral disturbance: Secondary | ICD-10-CM | POA: Diagnosis present

## 2019-04-16 DIAGNOSIS — Y712 Prosthetic and other implants, materials and accessory cardiovascular devices associated with adverse incidents: Secondary | ICD-10-CM | POA: Diagnosis present

## 2019-04-16 DIAGNOSIS — Z881 Allergy status to other antibiotic agents status: Secondary | ICD-10-CM

## 2019-04-16 DIAGNOSIS — Z515 Encounter for palliative care: Secondary | ICD-10-CM | POA: Diagnosis not present

## 2019-04-16 DIAGNOSIS — I132 Hypertensive heart and chronic kidney disease with heart failure and with stage 5 chronic kidney disease, or end stage renal disease: Secondary | ICD-10-CM | POA: Diagnosis present

## 2019-04-16 DIAGNOSIS — Y832 Surgical operation with anastomosis, bypass or graft as the cause of abnormal reaction of the patient, or of later complication, without mention of misadventure at the time of the procedure: Secondary | ICD-10-CM | POA: Diagnosis present

## 2019-04-16 DIAGNOSIS — K7460 Unspecified cirrhosis of liver: Secondary | ICD-10-CM | POA: Diagnosis present

## 2019-04-16 DIAGNOSIS — Z7189 Other specified counseling: Secondary | ICD-10-CM | POA: Diagnosis not present

## 2019-04-16 DIAGNOSIS — R52 Pain, unspecified: Secondary | ICD-10-CM

## 2019-04-16 DIAGNOSIS — N2581 Secondary hyperparathyroidism of renal origin: Secondary | ICD-10-CM | POA: Diagnosis present

## 2019-04-16 DIAGNOSIS — Z91048 Other nonmedicinal substance allergy status: Secondary | ICD-10-CM

## 2019-04-16 DIAGNOSIS — T82868A Thrombosis of vascular prosthetic devices, implants and grafts, initial encounter: Secondary | ICD-10-CM | POA: Diagnosis present

## 2019-04-16 DIAGNOSIS — Z638 Other specified problems related to primary support group: Secondary | ICD-10-CM | POA: Diagnosis not present

## 2019-04-16 DIAGNOSIS — I871 Compression of vein: Secondary | ICD-10-CM | POA: Diagnosis present

## 2019-04-16 DIAGNOSIS — Z880 Allergy status to penicillin: Secondary | ICD-10-CM

## 2019-04-16 DIAGNOSIS — N186 End stage renal disease: Secondary | ICD-10-CM | POA: Diagnosis present

## 2019-04-16 DIAGNOSIS — Z7951 Long term (current) use of inhaled steroids: Secondary | ICD-10-CM

## 2019-04-16 DIAGNOSIS — Z992 Dependence on renal dialysis: Secondary | ICD-10-CM

## 2019-04-16 DIAGNOSIS — N189 Chronic kidney disease, unspecified: Secondary | ICD-10-CM

## 2019-04-16 DIAGNOSIS — G40909 Epilepsy, unspecified, not intractable, without status epilepticus: Secondary | ICD-10-CM | POA: Diagnosis present

## 2019-04-16 DIAGNOSIS — Z91013 Allergy to seafood: Secondary | ICD-10-CM

## 2019-04-16 DIAGNOSIS — Z8674 Personal history of sudden cardiac arrest: Secondary | ICD-10-CM

## 2019-04-16 DIAGNOSIS — Z91041 Radiographic dye allergy status: Secondary | ICD-10-CM

## 2019-04-16 DIAGNOSIS — D500 Iron deficiency anemia secondary to blood loss (chronic): Secondary | ICD-10-CM

## 2019-04-16 DIAGNOSIS — Z79899 Other long term (current) drug therapy: Secondary | ICD-10-CM

## 2019-04-16 LAB — CBC WITH DIFFERENTIAL/PLATELET
Abs Immature Granulocytes: 0.03 10*3/uL (ref 0.00–0.07)
Basophils Absolute: 0 10*3/uL (ref 0.0–0.1)
Basophils Relative: 0 %
Eosinophils Absolute: 0.1 10*3/uL (ref 0.0–0.5)
Eosinophils Relative: 2 %
HCT: 18.3 % — ABNORMAL LOW (ref 39.0–52.0)
Hemoglobin: 5.4 g/dL — CL (ref 13.0–17.0)
Immature Granulocytes: 1 %
Lymphocytes Relative: 12 %
Lymphs Abs: 0.5 10*3/uL — ABNORMAL LOW (ref 0.7–4.0)
MCH: 32.9 pg (ref 26.0–34.0)
MCHC: 29.5 g/dL — ABNORMAL LOW (ref 30.0–36.0)
MCV: 111.6 fL — ABNORMAL HIGH (ref 80.0–100.0)
Monocytes Absolute: 0.6 10*3/uL (ref 0.1–1.0)
Monocytes Relative: 15 %
Neutro Abs: 3 10*3/uL (ref 1.7–7.7)
Neutrophils Relative %: 70 %
Platelets: UNDETERMINED 10*3/uL (ref 150–400)
RBC: 1.64 MIL/uL — ABNORMAL LOW (ref 4.22–5.81)
RDW: 19.1 % — ABNORMAL HIGH (ref 11.5–15.5)
WBC: 4.3 10*3/uL (ref 4.0–10.5)
nRBC: 0 % (ref 0.0–0.2)

## 2019-04-16 LAB — COMPREHENSIVE METABOLIC PANEL
ALT: 13 U/L (ref 0–44)
AST: 19 U/L (ref 15–41)
Albumin: 3.2 g/dL — ABNORMAL LOW (ref 3.5–5.0)
Alkaline Phosphatase: 59 U/L (ref 38–126)
Anion gap: 17 — ABNORMAL HIGH (ref 5–15)
BUN: 29 mg/dL — ABNORMAL HIGH (ref 8–23)
CO2: 28 mmol/L (ref 22–32)
Calcium: 8.4 mg/dL — ABNORMAL LOW (ref 8.9–10.3)
Chloride: 94 mmol/L — ABNORMAL LOW (ref 98–111)
Creatinine, Ser: 6.9 mg/dL — ABNORMAL HIGH (ref 0.61–1.24)
GFR calc Af Amer: 8 mL/min — ABNORMAL LOW (ref >60)
GFR calc non Af Amer: 7 mL/min — ABNORMAL LOW (ref >60)
Glucose, Bld: 75 mg/dL (ref 70–99)
Potassium: 3.8 mmol/L (ref 3.5–5.1)
Sodium: 139 mmol/L (ref 135–145)
Total Bilirubin: 0.4 mg/dL (ref 0.3–1.2)
Total Protein: 6.8 g/dL (ref 6.5–8.1)

## 2019-04-16 LAB — RETICULOCYTES
Immature Retic Fract: 31.1 % — ABNORMAL HIGH (ref 2.3–15.9)
RBC.: 1.72 MIL/uL — ABNORMAL LOW (ref 4.22–5.81)
Retic Count, Absolute: 101.8 10*3/uL (ref 19.0–186.0)
Retic Ct Pct: 5.9 % — ABNORMAL HIGH (ref 0.4–3.1)

## 2019-04-16 LAB — FOLATE: Folate: 20.6 ng/mL (ref >5.9)

## 2019-04-16 LAB — IRON AND TIBC
Iron: 77 ug/dL (ref 45–182)
Saturation Ratios: 29 % (ref 17.9–39.5)
TIBC: 266 ug/dL (ref 250–450)
UIBC: 189 ug/dL

## 2019-04-16 LAB — PHOSPHORUS: Phosphorus: 2.9 mg/dL (ref 2.5–4.6)

## 2019-04-16 LAB — POC OCCULT BLOOD, ED: Fecal Occult Bld: POSITIVE — AB

## 2019-04-16 LAB — VITAMIN B12: Vitamin B-12: 799 pg/mL (ref 180–914)

## 2019-04-16 LAB — PREPARE RBC (CROSSMATCH)

## 2019-04-16 LAB — FERRITIN: Ferritin: 850 ng/mL — ABNORMAL HIGH (ref 24–336)

## 2019-04-16 MED ORDER — LORAZEPAM 2 MG/ML IJ SOLN
0.5000 mg | Freq: Once | INTRAMUSCULAR | Status: AC
  Administered 2019-04-16: 0.5 mg via INTRAVENOUS
  Filled 2019-04-16: qty 1

## 2019-04-16 MED ORDER — LEVETIRACETAM 500 MG PO TABS
500.0000 mg | ORAL_TABLET | Freq: Two times a day (BID) | ORAL | Status: DC
  Administered 2019-04-17 – 2019-04-21 (×10): 500 mg via ORAL
  Filled 2019-04-16 (×11): qty 1

## 2019-04-16 MED ORDER — ZIPRASIDONE MESYLATE 20 MG IM SOLR: 10.0000 mg | Freq: Once | INTRAMUSCULAR | Status: AC

## 2019-04-16 MED ORDER — IPRATROPIUM BROMIDE HFA 17 MCG/ACT IN AERS
2.0000 | INHALATION_SPRAY | RESPIRATORY_TRACT | Status: DC | PRN
  Filled 2019-04-16: qty 12.9

## 2019-04-16 MED ORDER — SEVELAMER CARBONATE 800 MG PO TABS
1600.0000 mg | ORAL_TABLET | Freq: Three times a day (TID) | ORAL | Status: DC
  Administered 2019-04-18 – 2019-04-22 (×11): 1600 mg via ORAL
  Filled 2019-04-16 (×14): qty 2

## 2019-04-16 MED ORDER — QUETIAPINE FUMARATE 50 MG PO TABS
50.0000 mg | ORAL_TABLET | Freq: Every day | ORAL | Status: DC
  Administered 2019-04-17 – 2019-04-20 (×5): 50 mg via ORAL
  Filled 2019-04-16 (×6): qty 1

## 2019-04-16 MED ORDER — HYDRALAZINE HCL 25 MG PO TABS
25.0000 mg | ORAL_TABLET | Freq: Four times a day (QID) | ORAL | Status: DC | PRN
  Administered 2019-04-21: 25 mg via ORAL
  Filled 2019-04-16: qty 1

## 2019-04-16 MED ORDER — PANTOPRAZOLE SODIUM 40 MG IV SOLR
40.0000 mg | Freq: Two times a day (BID) | INTRAVENOUS | Status: DC
  Administered 2019-04-17 – 2019-04-20 (×7): 40 mg via INTRAVENOUS
  Filled 2019-04-16 (×9): qty 40

## 2019-04-16 MED ORDER — SODIUM CHLORIDE 0.9% IV SOLUTION: Freq: Once | INTRAVENOUS | Status: AC

## 2019-04-16 MED ORDER — ZIPRASIDONE MESYLATE 20 MG IM SOLR
INTRAMUSCULAR | Status: AC
  Administered 2019-04-16: 10 mg via INTRAMUSCULAR
  Filled 2019-04-16: qty 20

## 2019-04-16 NOTE — ED Notes (Signed)
This RN paged the admitting provider regarding the consent for this pt and their blood transfusion.

## 2019-04-16 NOTE — ED Triage Notes (Signed)
Pt presents to the ED by EMS from dialysis after receiving half of his dialysis treatment. Dialysis drew a H&H with a hemoglobin of 4.9 today. Pt has swelling to right arm. Pt has a hx of dementia. Nothing right arm due to fistula. 116/48, HR 63, resp 18, 93% RA, hx of COPD. CBG 109. Pt has dementia and reports that he thinks he cut himself with a saw and thinks he is now dying due to that.

## 2019-04-16 NOTE — Progress Notes (Signed)
[facility] NEPHROLOGY PROGRESS NOTE  Assessment/ Plan: Pt is a 74 y.o. yo male with history of hypertension, HLD, seizure, ESRD on HD MWF at [facility] sent to the hospital for severe anemia, seen as a consultation for ESRD management.  Patient was seen during rounds at the kidney center today morning when he was looking pale.  We checked stat hemoglobin which came back 4.9 therefore he was directed to the ER for further evaluation.  Patient denied any GI bleed however the history is limited because of his encephalopathy.  He completed his treatment today.  Patient is now admitted for further evaluation and GI consult.  OP HD orders:  [facility], MWF, 4 hours, 350/800, 2K, 2.25 ca, 180 NR, RUE AVF Venofer 50 mg weekly PTH 718.   #Severe anemia: Rule out bleeding.  GI was consulted.  Plan for 2 units of blood transfusion today.  Order iron studies. ESA pending iron level.   # ESRD: MWF schedule.  Had a complete treatment today.  Plan for next dialysis on Wednesday.  AV fistula for the access  # Secondary hyperparathyroidism: Check phosphorus level.  Resume Renvela.  # HTN/volume: Pressure acceptable.  Continue current medication.  #Concerns of facial swelling: There was concern of facial swelling at dialysis unit today.  He has right upper extremity swelling as well.  He had recent IJ catheter.  I will consult IR for fistulogram. ? SVC syndrome.  Subjective: Seen and examined in the ER.  Patient was confused.  He received Ativan little earlier.  Review of system limited.  Plan to receive 2 units of blood transfusion. Objective Vital signs in last 24 hours: Vitals:   04/16/19 1632 04/16/19 1700 04/16/19 1939 04/16/19 1945  BP:  (!) 106/45 129/63 (!) 141/61  Pulse:  81 74   Resp:  17 14 15   Temp:   (!) 97.5 F (36.4 C)   TempSrc:   Oral   SpO2: 93% 93% 94%   Weight:      Height:       Weight change:  No intake or output data in the 24 hours ending 04/16/19  2001     Labs: Basic Metabolic Panel: Recent Labs  Lab 04/16/19 1639  NA 139  K 3.8  CL 94*  CO2 28  GLUCOSE 75  BUN 29*  CREATININE 6.90*  CALCIUM 8.4*   Liver Function Tests: Recent Labs  Lab 04/16/19 1639  AST 19  ALT 13  ALKPHOS 59  BILITOT 0.4  PROT 6.8  ALBUMIN 3.2*   No results for input(s): LIPASE, AMYLASE in the last 168 hours. No results for input(s): AMMONIA in the last 168 hours. CBC: Recent Labs  Lab 04/16/19 1639  WBC 4.3  NEUTROABS 3.0  HGB 5.4*  HCT 18.3*  MCV 111.6*  PLT PLATELET CLUMPS NOTED ON SMEAR, UNABLE TO ESTIMATE   Cardiac Enzymes: No results for input(s): CKTOTAL, CKMB, CKMBINDEX, TROPONINI in the last 168 hours. CBG: No results for input(s): GLUCAP in the last 168 hours.  Iron Studies: No results for input(s): IRON, TIBC, TRANSFERRIN, FERRITIN in the last 72 hours. Studies/Results: DG Chest 1 View  Result Date: 04/16/2019 CLINICAL DATA:  Low hemoglobin EXAM: CHEST  1 VIEW COMPARISON:  06/11/2018 FINDINGS: Moderate cardiomegaly with mild central congestion. No overt edema, pleural effusion or pneumothorax. Aortic atherosclerosis. IMPRESSION: Cardiomegaly with mild central congestion Electronically Signed   By: Donavan Foil M.D.   On: 04/16/2019 19:01    Medications: Infusions:  Scheduled Medications: . sodium chloride   Intravenous Once  . levETIRAcetam  500 mg Oral BID  . pantoprazole (PROTONIX) IV  40 mg Intravenous Q12H  . QUEtiapine  50 mg Oral QHS  . [START ON 04/17/2019] sevelamer carbonate  1,600 mg Oral TID WC    have reviewed scheduled and prn medications.  Physical Exam: General:NAD, comfortable, confused male, Heart:RRR, s1s2 nl Lungs:clear b/l, no crackle Abdomen:soft, Non-tender, non-distended Extremities:No edema Dialysis Access: Right upper extremity AV fistula, has swelling.  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  [facility] 04/16/2019,8:01 PM  LOS: 0 days  Pager: ID:[phone]

## 2019-04-16 NOTE — ED Notes (Signed)
Pt agitated, attempted to get out of bed despite redirection. Pt attempted to pull off BP cuff, cardiac monitor leads. RN attempted to explain to the pt that he had to stay in bed and not to pull his monitoring devices off, pt told RN to "get the f* out of here"

## 2019-04-16 NOTE — H&P (Signed)
History and Physical    [facility] [id number] DOB: 1945-08-30 DOA: 04/16/2019  PCP: Rosaria Ferries, MD   Patient coming from: Home  I have personally briefly reviewed patient's old medical records in [place]  Chief Complaint: I feel Ok  HPI: BRIDGER REDDIC is a 74 y.o. male with medical history significant of ESRD on HD, HTN, HLD, Seizure, presented with anemia. Pt went to his regular HD today and staff found him looked pale and drew blood. Hb was 4.5 and pt was [facility] ED. Pt was very agitated in the ED but denied any abd pain, no SOB, no chest pain, he also denied any black tarry stool or blood in stool.  ED Course: FOBT positive, repeat Hb 5.4. PRBC x2 ordered.  Review of Systems: Pt refused to answer any more ROS questions other than mentioned above.  Past Medical History:  Diagnosis Date  . Acute on chronic respiratory failure with hypoxia ([place])   . Chronic systolic heart failure ([place])   . End stage renal disease on dialysis ([place])   . Lobar pneumonia ([place])   . Seizure disorder ([place])        has no history on file for tobacco, alcohol, and drug.  Allergies  Allergen Reactions  . Ampicillin   . Elastic Bandages & [Zinc]   . Fish-Derived Products   . Shellfish-Derived Products   . Tetracyclines & Related     No family history on file.   Prior to Admission medications   Not on File    Physical Exam: Vitals:   04/16/19 1629 04/16/19 1632 04/16/19 1700  BP:   (!) 106/45  Pulse:   81  Resp:   17  SpO2:  93% 93%  Weight: 65.7 kg    Height: 6' (1.829 m)      Constitutional: NAD, calm, comfortable Vitals:   04/16/19 1629 04/16/19 1632 04/16/19 1700  BP:   (!) 106/45  Pulse:   81  Resp:   17  SpO2:  93% 93%  Weight: 65.7 kg    Height: 6' (1.829 m)     Eyes: PERRL, lids and conjunctivae normal ENMT: Looks pale  Neck: normal, supple, no masses, no thyromegaly Respiratory: clear to auscultation bilaterally, no wheezing, no  crackles. Normal respiratory effort. No accessory muscle use.  Cardiovascular: Regular rate and rhythm, no murmurs / rubs / gallops. No extremity edema. 2+ pedal pulses. No carotid bruits.  Abdomen: no tenderness, no masses palpated. No hepatosplenomegaly. Bowel sounds positive.  Musculoskeletal: no clubbing / cyanosis. No joint deformity upper and lower extremities. Good ROM, no contractures. Normal muscle tone.  Skin: no rashes, lesions, ulcers. No induration Neurologic: Moving all limbs, follow simple command.  Psychiatric: Agitated.     Labs on Admission: I have personally reviewed following labs and imaging studies  CBC: Recent Labs  Lab 04/16/19 1639  WBC 4.3  NEUTROABS 3.0  HGB 5.4*  HCT 18.3*  MCV 111.6*  PLT PLATELET CLUMPS NOTED ON SMEAR, UNABLE TO ESTIMATE   Basic Metabolic Panel: Recent Labs  Lab 04/16/19 1639  NA 139  K 3.8  CL 94*  CO2 28  GLUCOSE 75  BUN 29*  CREATININE 6.90*  CALCIUM 8.4*   GFR: Estimated Creatinine Clearance: 9.1 mL/min (A) (by C-G formula based on SCr of 6.9 mg/dL (H)). Liver Function Tests: Recent Labs  Lab 04/16/19 1639  AST 19  ALT 13  ALKPHOS 59  BILITOT 0.4  PROT 6.8  ALBUMIN  3.2*   No results for input(s): LIPASE, AMYLASE in the last 168 hours. No results for input(s): AMMONIA in the last 168 hours. Coagulation Profile: No results for input(s): INR, PROTIME in the last 168 hours. Cardiac Enzymes: No results for input(s): CKTOTAL, CKMB, CKMBINDEX, TROPONINI in the last 168 hours. BNP (last 3 results) No results for input(s): PROBNP in the last 8760 hours. HbA1C: No results for input(s): HGBA1C in the last 72 hours. CBG: No results for input(s): GLUCAP in the last 168 hours. Lipid Profile: No results for input(s): CHOL, HDL, LDLCALC, TRIG, CHOLHDL, LDLDIRECT in the last 72 hours. Thyroid Function Tests: No results for input(s): TSH, T4TOTAL, FREET4, T3FREE, THYROIDAB in the last 72 hours. Anemia Panel: No results  for input(s): VITAMINB12, FOLATE, FERRITIN, TIBC, IRON, RETICCTPCT in the last 72 hours. Urine analysis: No results found for: COLORURINE, APPEARANCEUR, LABSPEC, [place], GLUCOSEU, HGBUR, BILIRUBINUR, KETONESUR, PROTEINUR, UROBILINOGEN, NITRITE, LEUKOCYTESUR  Radiological Exams on Admission: DG Chest 1 View  Result Date: 04/16/2019 CLINICAL DATA:  Low hemoglobin EXAM: CHEST  1 VIEW COMPARISON:  06/11/2018 FINDINGS: Moderate cardiomegaly with mild central congestion. No overt edema, pleural effusion or pneumothorax. Aortic atherosclerosis. IMPRESSION: Cardiomegaly with mild central congestion Electronically Signed   By: Donavan Foil M.D.   On: 04/16/2019 19:01    EKG: Odered  Assessment/Plan Active Problems:   Iron deficiency anemia due to chronic blood loss  Acute on Chronic Anemia Macrocytic now (Normocytic in June 2020 [facility] record) [facility] record showed that pt's Hb was 6 in June 2020, and was transfused with PRBC x2 but no further record of GI work-up. And his most recent Hb reading in December 2020 was 11.8. In June last year's admission pt had "  CT on 02/26/2019 showed "No definitive findings of cirrhosis" but "Sigmoid diverticulosis without diverticulitis" Pt can not provide any useful info about lower GI bleeding this time, but his June 2020 admission said that he had "some minimal red blood in stool a few days ago"  And looks like his H/H has remained stable since than till December 2020. Sum up all info, suspect lower GI bleed from Diverticulitis. D/W GI attending Dr. Phil Dopp who will see Pt in AM. PPI for now. Recheck H/H tonight and in AM  ESRD D/W on call nephro attending Dr. Carolin Sicks No emergency HD today  Question of CHF X-ray reported cardiomegaly and lung congestion but compared to his old Xray there is no change, and he is asymptomatic. [facility] Echo in 04/2018 showed Grade I diastolic   HTN Borderline hypotension now likely from anemia Hold all BP meds  now  Agitation Resume HS seroquel Add Halodol PRN EKG ordered.    DVT prophylaxis: SCD Code Status: Assumed Full Code Family Communication: Left both sisters messages, but no reply Disposition Plan: Home once GI plan settled Consults called: GI Dr. Phil Dopp and Nephro Dr. Carolin Sicks Admission status: Tele admission   Lequita Halt MD [facility] Pager [phone]   04/16/2019, 7:12 PM

## 2019-04-16 NOTE — ED Provider Notes (Signed)
[facility] EMERGENCY DEPARTMENT Provider Note   CSN: [id number] Arrival date & time: 04/16/19  1618     History Chief Complaint  Patient presents with  . Abnormal Lab    Jared Hancock is a 74 y.o. male.  Pt is here from a dialysis center in [place] after having a hemoglobin reading of 4.8.  Pt has a history of dementia.  He is unable to give any history.  [facility] months ago.    The history is provided by the patient. No language interpreter was used.  Abnormal Lab Patient referred by:  ED personnel      Past Medical History:  Diagnosis Date  . Acute on chronic respiratory failure with hypoxia ([place])   . Chronic systolic heart failure ([place])   . End stage renal disease on dialysis ([place])   . Lobar pneumonia ([place])   . Seizure disorder [facility])     Patient Active Problem List   Diagnosis Date Noted  . Acute on chronic respiratory failure with hypoxia ([place])   . Seizure disorder ([place])   . End stage renal disease on dialysis ([place])   . Lobar pneumonia ([place])   . Chronic systolic heart failure (H[facility])          No family history on file.  Social History   Tobacco Use  . Smoking status: Not on file  Substance Use Topics  . Alcohol use: Not on file  . Drug use: Not on file    Home Medications Prior to Admission medications   Not on File    Allergies    Ampicillin, Elastic bandages & [zinc], Fish-derived products, Shellfish-derived products, and Tetracyclines & related  Review of Systems   Review of Systems  Unable to perform ROS: Dementia  All other systems reviewed and are negative.   Physical Exam Updated Vital Signs BP (!) 106/45   Pulse 81   Resp 17   Ht 6' (1.829 m)   Wt 65.7 kg   SpO2 93%   BMI 19.64 kg/m   Physical Exam Vitals and nursing note reviewed.  Constitutional:      Appearance: He is well-developed.  HENT:     Head: Normocephalic.  Eyes:     Conjunctiva/sclera: Conjunctivae normal.   Cardiovascular:     Rate and Rhythm: Normal rate and regular rhythm.     Heart sounds: No murmur.  Pulmonary:     Effort: Pulmonary effort is normal. No respiratory distress.     Breath sounds: Normal breath sounds.  Abdominal:     Palpations: Abdomen is soft.     Tenderness: There is no abdominal tenderness.  Skin:    General: Skin is warm and dry.  Neurological:     General: No focal deficit present.     Mental Status: He is alert.  Psychiatric:        Mood and Affect: Mood normal.     ED Results / Procedures / Treatments   Labs (all labs ordered are listed, but only abnormal results are displayed) Labs Reviewed  CBC WITH DIFFERENTIAL/PLATELET - Abnormal; Notable for the following components:      Result Value   RBC 1.64 (*)    Hemoglobin 5.4 (*)    HCT 18.3 (*)    MCV 111.6 (*)    MCHC 29.5 (*)    RDW 19.1 (*)    Lymphs Abs 0.5 (*)    All other components within normal limits  COMPREHENSIVE METABOLIC PANEL - Abnormal; Notable for the following components:   Chloride 94 (*)    BUN 29 (*)    Creatinine, Ser 6.90 (*)    Calcium 8.4 (*)    Albumin 3.2 (*)    GFR calc non Af Amer 7 (*)    GFR calc Af Amer 8 (*)    Anion gap 17 (*)    All other components within normal limits  TYPE AND SCREEN  PREPARE RBC (CROSSMATCH)    EKG None  Radiology No results found.  Procedures .Critical Care Performed by: Fransico Meadow, PA-C Authorized by: Fransico Meadow, PA-C   Critical care provider statement:    Critical care time (minutes):  60   Critical care start time:  04/16/2019 4:00 AM   Critical care end time:  04/16/2019 6:51 PM   Critical care time was exclusive of:  Separately billable procedures and treating other patients   Critical care was necessary to treat or prevent imminent or life-threatening deterioration of the following conditions:  CNS failure or compromise, hepatic failure and renal failure   Critical care was time spent personally by me on the  following activities:  Blood draw for specimens, development of treatment plan with patient or surrogate, discussions with consultants, review of old charts, examination of patient, ordering and review of laboratory studies and ordering and performing treatments and interventions   I assumed direction of critical care for this patient from another provider in my specialty: no     (including critical care time)  Medications Ordered in ED Medications  LORazepam (ATIVAN) injection 0.5 mg (has no administration in time range)  0.9 %  sodium chloride infusion (Manually program via Guardrails IV Fluids) (has no administration in time range)    ED Course  I have reviewed the triage vital signs and the nursing notes.  Pertinent labs & imaging results that were available during my care of the patient were reviewed by me and considered in my medical decision making (see chart for details).    MDM Rules/Calculators/A&P                     Hemocult positive.  Pt's hemoglobin is 5.4.  Blood ordered.    Final Clinical Impression(s) / ED Diagnoses Final diagnoses:  Anemia, unspecified type  Gastrointestinal hemorrhage, unspecified gastrointestinal hemorrhage type    Rx / DC Orders ED Discharge Orders    None       Sidney Ace 04/16/19 1854    Veryl Speak, MD 04/16/19 2234

## 2019-04-16 NOTE — ED Notes (Signed)
This RN attempted to contact family regarding this pts consent. This RN was unable to contact family to obtain consent. Due to this pts AMS and low Hgb level; provider states to begin transfusion as emergent.

## 2019-04-16 NOTE — ED Notes (Signed)
PIV had not been successful so far.

## 2019-04-17 ENCOUNTER — Other Ambulatory Visit: Payer: Self-pay

## 2019-04-17 DIAGNOSIS — D62 Acute posthemorrhagic anemia: Secondary | ICD-10-CM

## 2019-04-17 LAB — BASIC METABOLIC PANEL
Anion gap: 16 — ABNORMAL HIGH (ref 5–15)
BUN: 33 mg/dL — ABNORMAL HIGH (ref 8–23)
CO2: 28 mmol/L (ref 22–32)
Calcium: 8.8 mg/dL — ABNORMAL LOW (ref 8.9–10.3)
Chloride: 96 mmol/L — ABNORMAL LOW (ref 98–111)
Creatinine, Ser: 8.29 mg/dL — ABNORMAL HIGH (ref 0.61–1.24)
GFR calc Af Amer: 7 mL/min — ABNORMAL LOW (ref >60)
GFR calc non Af Amer: 6 mL/min — ABNORMAL LOW (ref >60)
Glucose, Bld: 74 mg/dL (ref 70–99)
Potassium: 4.1 mmol/L (ref 3.5–5.1)
Sodium: 140 mmol/L (ref 135–145)

## 2019-04-17 LAB — MRSA PCR SCREENING: MRSA by PCR: NEGATIVE

## 2019-04-17 LAB — SARS CORONAVIRUS 2 (TAT 6-24 HRS): SARS Coronavirus 2: NEGATIVE

## 2019-04-17 LAB — HEMOGLOBIN AND HEMATOCRIT, BLOOD
HCT: 26.3 % — ABNORMAL LOW (ref 39.0–52.0)
Hemoglobin: 8.5 g/dL — ABNORMAL LOW (ref 13.0–17.0)

## 2019-04-17 MED ORDER — PREDNISONE 50 MG PO TABS
50.0000 mg | ORAL_TABLET | Freq: Four times a day (QID) | ORAL | Status: AC
  Administered 2019-04-17 (×2): 50 mg via ORAL
  Filled 2019-04-17 (×2): qty 1

## 2019-04-17 MED ORDER — DIPHENHYDRAMINE HCL 50 MG/ML IJ SOLN
50.0000 mg | Freq: Once | INTRAMUSCULAR | Status: DC
  Filled 2019-04-17: qty 1

## 2019-04-17 MED ORDER — LORAZEPAM 2 MG/ML IJ SOLN
0.5000 mg | INTRAMUSCULAR | Status: DC | PRN
  Administered 2019-04-17 – 2019-04-23 (×8): 0.5 mg via INTRAVENOUS
  Filled 2019-04-17 (×8): qty 1

## 2019-04-17 MED ORDER — IPRATROPIUM BROMIDE 0.02 % IN SOLN: 0.5000 mg | RESPIRATORY_TRACT | Status: DC | PRN

## 2019-04-17 MED ORDER — DIPHENHYDRAMINE HCL 50 MG/ML IJ SOLN: 50.0000 mg | Freq: Once | INTRAMUSCULAR | Status: DC

## 2019-04-17 MED ORDER — PREDNISONE 50 MG PO TABS: 50.0000 mg | ORAL_TABLET | Freq: Four times a day (QID) | ORAL | Status: DC

## 2019-04-17 MED ORDER — DIPHENHYDRAMINE HCL 25 MG PO CAPS: 50.0000 mg | ORAL_CAPSULE | Freq: Once | ORAL | Status: DC

## 2019-04-17 MED ORDER — CHLORHEXIDINE GLUCONATE CLOTH 2 % EX PADS
6.0000 | MEDICATED_PAD | Freq: Every day | CUTANEOUS | Status: DC
  Administered 2019-04-18 – 2019-04-24 (×3): 6 via TOPICAL

## 2019-04-17 NOTE — TOC Initial Note (Signed)
Transition of Care [facility]) - Initial/Assessment Note    Patient Details  Name: Jared Hancock MRN: [id number] Date of Birth: September 01, 1945  [facility]) CM/SW Contact:    Sable Feil, LCSW Phone Number: 04/17/2019, 1:03 PM  Clinical Narrative:  Niece, Kongmeng Pulkrabek contacted [phone]) and confirmed that patient is from [place] and the plan is for him to return. Ms. Hedges informed CSW that he had been in a SNF in [place], [place] and they sent him to [place].  Call made to [facility], and per Arbie Cookey in admissions Mr. Mondor has been with them 6 months or less and was transferred from the facility in [place] to their facility as he need their locked unit. Arbie Cookey advised that she will be contacted once patient ready for discharge.                  Expected Discharge Plan: Skilled Nursing Facility([facility]) Barriers to Discharge: Continued Medical Work up   Patient Goals and CMS Choice Patient states their goals for this hospitalization and ongoing recovery are:: Did not talk with patient. Talked with niece Oziel Bulla by phone and the discharge plan is for patient to return to [facility]. CMS Medicare.gov Compare Post Acute Care list provided to:: Other (Comment Required)(Not needed as patient will return to [facility]) Choice offered to / list presented to : NA  Expected Discharge Plan and Services Expected Discharge Plan: Skilled Nursing Facility([facility]) In-house Referral: Clinical Social Work Discharge Planning Services: Other - See comment(Back to SNF)   Living arrangements for the past 2 months: Skilled Nursing Facility([facility])                                      Prior Living Arrangements/Services Living arrangements for the past 2 months: Skilled Nursing Facility([facility]) Lives with:: Facility Resident Patient language and need for interpreter reviewed:: No Do you feel safe  going back to the place where you live?: Yes(Niece did not express any concerns regardng patient returning to SNF)      Need for Family Participation in Patient Care: No (Comment) Care giver support system in place?: Yes (comment)(At SNF)   Criminal Activity/Legal Involvement Pertinent to Current Situation/Hospitalization: No - Comment as needed  Activities of Daily Living   ADL Screening (condition at time of admission) Patient's cognitive ability adequate to safely complete daily activities?: No Is the patient deaf or have difficulty hearing?: No Does the patient have difficulty seeing, even when wearing glasses/contacts?: No Does the patient have difficulty concentrating, remembering, or making decisions?: Yes Patient able to express need for assistance with ADLs?: No Does the patient have difficulty dressing or bathing?: Yes Independently performs ADLs?: No Communication: Needs assistance Dressing (OT): Needs assistance Grooming: Needs assistance Feeding: Independent Bathing: Needs assistance Toileting: Needs assistance In/Out Bed: Needs assistance  Permission Sought/Granted   Permission granted to share information with : No(Patient not oriented at time of assessment)              Emotional Assessment Appearance:: Other (Comment Required(Did not visit with patient as he is currently not oriented) Attitude/Demeanor/Rapport: Unable to Assess Affect (typically observed): Unable to Assess Orientation: : Oriented to Self Alcohol / Substance Use: Other (comment)(No history on file regarding smoking, drinking alcohol or using illicit drugs) Psych Involvement: No (comment)  Admission diagnosis:  Acute blood loss anemia [D62] Anemia [D64.9]  ESRD (end stage renal disease) ([place]) [N18.6] Gastrointestinal hemorrhage, unspecified gastrointestinal hemorrhage type [K92.2] Anemia, unspecified type [D64.9] Chronic renal failure, unspecified CKD stage [N18.9] Patient Active Problem  List   Diagnosis Date Noted  . Iron deficiency anemia due to chronic blood loss 04/16/2019  . Acute blood loss anemia 04/16/2019  . Anemia   . Acute on chronic respiratory failure with hypoxia ([place])   . Seizure disorder ([place])   . End stage renal disease on dialysis ([place])   . Lobar pneumonia ([place])   . Chronic systolic heart failure ([place])    PCP:  Rosaria Ferries, MD Pharmacy:   [place], [place] - [address] [place] [address] Phone: [phone] Fax: [phone]     Social Determinants of Health (SDOH) Interventions  No SDOH intervention requested or needed at this time.  Readmission Risk Interventions No flowsheet data found.

## 2019-04-17 NOTE — Progress Notes (Signed)
Patient with reported anaphylactic reaction to shellfish planned for fistulagram 04/18/19 at 11:00 am -- orders placed for patient to receive contrast pre-medications as follows:  Prednisone 50 mg PO Q6H x 3 doses starting at 2100 tonight Benadryl 50 mg PO x 1 dose at 1000 tomorrow (2/17)  Please call IR with questions or concerns.  Candiss Norse, PA-C

## 2019-04-17 NOTE — Consult Note (Signed)
Chief Complaint: Patient was seen in consultation today for right arm dialysis graft evaluation with possible intervention Chief Complaint  Patient presents with  . Abnormal Lab   at the request of Dr Kathaleen Bury  Supervising Physician: Daryll Brod  Patient Status: [facility] - In-pt  History of Present Illness: Jared Hancock is a 74 y.o. male   ESRD To ED yesterday from dialysis  Swelling in face and right arm Pale appearance (hg 4.9 at [place]) Confusion  Almost full session of dialysis completed per Chart Sent to ED for evaluation and GI consult  Hg 5.4 in ED 2 units this am Hg pending   Request for right arm dialysis access evaluation and possible intervention  Pt has allergy to "shellfish" per chart Will discuss with Interventional Radiologist  Past Medical History:  Diagnosis Date  . Acute on chronic respiratory failure with hypoxia ([place])   . Chronic systolic heart failure ([place])   . End stage renal disease on dialysis ([place])   . Lobar pneumonia ([place])   . Seizure disorder ([facility])       Allergies: Ampicillin, Fish-derived products, Shellfish allergy, Tetracyclines & related, and Elastic bandages & [zinc]  Medications: Prior to Admission medications   Medication Sig Start Date End Date Taking? Authorizing Provider  albuterol (VENTOLIN HFA) 108 (90 Base) MCG/ACT inhaler Inhale 2 puffs into the lungs every 6 (six) hours as needed for wheezing. 03/02/19   [provider]  amLODipine (NORVASC) 5 MG tablet Take 5 mg by mouth daily. 03/26/19   [provider]  budesonide-formoterol (SYMBICORT) 160-4.5 MCG/ACT inhaler Inhale 2 puffs into the lungs 2 (two) times daily. 03/30/19   [provider]  divalproex (DEPAKOTE SPRINKLE) 125 MG capsule Take 125 mg by mouth 2 (two) times daily. 04/12/19   [provider]  hydrALAZINE (APRESOLINE) 100 MG tablet Take 100 mg by mouth 3 (three) times daily. 03/26/19   [provider]  lisinopril  (ZESTRIL) 30 MG tablet Take 15 mg by mouth daily. 03/02/19   [provider]  QUEtiapine (SEROQUEL) 50 MG tablet Take 50 mg by mouth at bedtime. 03/26/19   [provider]  rosuvastatin (CRESTOR) 5 MG tablet Take 5 mg by mouth daily. 03/02/19   [provider]  sertraline (ZOLOFT) 25 MG tablet Take 25 mg by mouth daily. 03/02/19   [provider]  sertraline (ZOLOFT) 50 MG tablet Take 50 mg by mouth daily. 03/02/19   [provider]  SPIRIVA HANDIHALER 18 MCG inhalation capsule Place 1 capsule into inhaler and inhale daily. 03/30/19   [provider]     No family history on file.  Social History   Socioeconomic History  . Marital status: Divorced    Spouse name: Not on file  . Number of children: Not on file  . Years of education: Not on file  . Highest education level: Not on file  Occupational History  . Not on file  Tobacco Use  . Smoking status: Not on file  Substance and Sexual Activity  . Alcohol use: Not on file  . Drug use: Not on file  . Sexual activity: Not on file  Other Topics Concern  . Not on file  Social History Narrative  . Not on file   Social Determinants of Health   Financial Resource Strain:   . Difficulty of Paying Living Expenses: Not on file  Food Insecurity:   . Worried About Charity fundraiser in the Last Year: Not  on file  . Ran Out of Food in the Last Year: Not on file  Transportation Needs:   . Lack of Transportation (Medical): Not on file  . Lack of Transportation (Non-Medical): Not on file  Physical Activity:   . Days of Exercise per Week: Not on file  . Minutes of Exercise per Session: Not on file  Stress:   . Feeling of Stress : Not on file  Social Connections:   . Frequency of Communication with Friends and Family: Not on file  . Frequency of Social Gatherings with Friends and Family: Not on file  . Attends Religious Services: Not on file  . Active Member of Clubs or Organizations: Not on  file  . Attends Archivist Meetings: Not on file  . Marital Status: Not on file    Review of Systems: A 12 point ROS discussed and pertinent positives are indicated in the HPI above.  All other systems are negative.  Review of Systems  HENT: Positive for facial swelling.   Respiratory: Negative for cough and shortness of breath.   Cardiovascular: Negative for chest pain and leg swelling.  Gastrointestinal: Negative for abdominal pain.  Psychiatric/Behavioral: Positive for agitation and confusion.    Vital Signs: BP (!) 126/55   Pulse (!) 51   Temp 98 F (36.7 C) (Oral)   Resp 20   Ht 6' (1.829 m)   Wt 144 lb 12.8 oz (65.7 kg)   SpO2 100%   BMI 19.64 kg/m   Physical Exam Vitals reviewed.  Constitutional:      Comments: Agitated in bed Fidgety in bed   HENT:     Head:     Comments: Face is minimally swollen Breathing easily Cardiovascular:     Rate and Rhythm: Normal rate and regular rhythm.     Heart sounds: Normal heart sounds.  Pulmonary:     Breath sounds: Normal breath sounds.  Abdominal:     Palpations: Abdomen is soft.  Musculoskeletal:        General: Normal range of motion.     Comments: Left arm dialysis graft-- thrombosed  Right arm dialysis fistula: good pulse and thrill Right arm does reveal swelling of entire arm NT  Skin:    General: Skin is warm and dry.  Neurological:     Mental Status: He is disoriented.  Psychiatric:     Comments: Consented with Niece [name] via phone     Imaging: DG Chest 1 View  Result Date: 04/16/2019 CLINICAL DATA:  Low hemoglobin EXAM: CHEST  1 VIEW COMPARISON:  06/11/2018 FINDINGS: Moderate cardiomegaly with mild central congestion. No overt edema, pleural effusion or pneumothorax. Aortic atherosclerosis. IMPRESSION: Cardiomegaly with mild central congestion Electronically Signed   By: Donavan Foil M.D.   On: 04/16/2019 19:01    Labs:  CBC: Recent Labs    06/16/18 0616 06/19/18 1010  06/21/18 0510 04/16/19 1639  WBC 7.4 7.5 7.7 4.3  HGB 10.8* 10.0* 10.3* 5.4*  HCT 34.9* 31.3* 31.6* 18.3*  PLT 181 211 206 PLATELET CLUMPS NOTED ON SMEAR, UNABLE TO ESTIMATE    COAGS: Recent Labs    05/04/18 0651  INR 1.0    BMP: Recent Labs    06/16/18 0616 06/16/18 0616 06/19/18 1010 06/20/18 1332 06/21/18 0510 04/16/19 1639  NA 136  --  134*  --  137 139  K 4.2   < > 5.6* 5.5* 5.2* 3.8  CL 95*  --  95*  --  95* 94*  CO2 23  --  18*  --  21* 28  GLUCOSE 112*  --  55*  --  97 75  BUN 58*  --  82*  --  89* 29*  CALCIUM 9.7  --  9.8  --  9.9 8.4*  CREATININE 8.23*  --  9.88*  --  9.61* 6.90*  GFRNONAA 6*  --  5*  --  5* 7*  GFRAA 7*  --  5*  --  6* 8*   < > = values in this interval not displayed.    LIVER FUNCTION TESTS: Recent Labs    05/04/18 0651 05/05/18 0549 05/15/18 0657 05/17/18 0534 06/16/18 0616 06/19/18 1010 06/21/18 0510 04/16/19 1639  BILITOT 0.6  --  0.4  --   --   --   --  0.4  AST 28  --  28  --   --   --   --  19  ALT 30  --  20  --   --   --   --  13  ALKPHOS 89  --  78  --   --   --   --  59  PROT 7.5  --  6.8  --   --   --   --  6.8  ALBUMIN 2.3*   < > 2.0*   < > 2.9* 2.8* 2.7* 3.2*   < > = values in this interval not displayed.    TUMOR MARKERS: No results for input(s): AFPTM, CEA, CA199, CHROMGRNA in the last 8760 hours.  Assessment and Plan:  ESRD Left arm access-- thrombosed Right arm access good thrill and pulse Swelling noted face and rt arm Scheduled for Rt arm dialysis fistula evaluation with possible intervention Possible tunneled dialysis catheter placement Pts niece [name] is aware of procedure benefits and risks including but not limited to Infection; bleeding; damage to surrounding structures  Will discuss with Dr Annamaria Boots shellfish allergy history Plan for either today or tomorrow RN aware  Thank you for this interesting consult.  I greatly enjoyed meeting SMOKEY LENAHAN and look forward to participating in their  care.  A copy of this report was sent to the requesting provider on this date.  Electronically Signed: Lavonia Drafts, PA-C 04/17/2019, 8:22 AM   I spent a total of 40 Minutes    in face to face in clinical consultation, greater than 50% of which was counseling/coordinating care for right arm dialysis access evaluation/intervention

## 2019-04-17 NOTE — Consult Note (Signed)
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  Upon presentation yesterday for routine dialysis he appeared to be pale.  Blood work was performed and he was noted to have an HGB of 4.5 g/dL, per Dr. Delories Heinz note.  In the ER his blood work was repeated and it was found to be 5.4 g/dL.  His last HGB in Epic was at 10.3 g/dL and it appears to be his baseline.  The patient does not report having any issues with melena or hematochezia.   Reports from a hospitalization at [facility] in 07/2018 showed that his HGB was 6 g/dL and he was transfused 2 units of PRBC.  There was mention of some hematochezia at that time, but no GI work up was performed.  During March and April last year he was hospitalized for a seizure and he remained unresponsive with agonal respirations.  Subsequently he suffered a cardiac arrest, but he was resuscitated with CPR.  An echocardiogram performed at that time showed that he had an EF of 55-60%.    Past Medical History:  Diagnosis Date  . Acute on chronic respiratory failure with hypoxia ([place])   . Chronic systolic heart failure ([place])   . End stage renal disease on dialysis ([place])   . Lobar pneumonia ([place])   . Seizure disorder ([place])     No family history on file.  Social History:  has no history on file for tobacco, alcohol, and drug.  Allergies:  Allergies  Allergen Reactions  . Ampicillin Anaphylaxis  . Fish-Derived Products Anaphylaxis  . Shellfish Allergy Anaphylaxis  . Tetracyclines & Related Swelling  . Elastic Bandages & [Zinc] Rash    Paper tape okay    Medications:  Scheduled: . Chlorhexidine Gluconate Cloth  6 each Topical Q0600  . [START ON 04/18/2019] diphenhydrAMINE  50 mg Oral Once   Or  . [START ON 04/18/2019] diphenhydrAMINE   50 mg Intravenous Once  . levETIRAcetam  500 mg Oral BID  . pantoprazole (PROTONIX) IV  40 mg Intravenous Q12H  . predniSONE  50 mg Oral Q6H  . QUEtiapine  50 mg Oral QHS  . sevelamer carbonate  1,600 mg Oral TID WC   Continuous:   Results for orders placed or performed during the hospital encounter of 04/16/19 (from the past 24 hour(s))  Prepare RBC     Status: None   Collection Time: 04/16/19  6:04 PM  Result Value Ref Range   Order Confirmation      ORDER PROCESSED BY BLOOD BANK Performed at [facility], [place] [address], [place], [place] [address]   SARS CORONAVIRUS 2 (TAT 6-24 HRS) Nasopharyngeal Nasopharyngeal Swab     Status: None   Collection Time: 04/16/19  6:16 PM   Specimen: Nasopharyngeal Swab  Result Value Ref Range   SARS Coronavirus 2 NEGATIVE NEGATIVE  POC occult blood, ED Provider will collect     Status: Abnormal   Collection Time: 04/16/19  6:51 PM  Result Value Ref Range   Fecal Occult Bld POSITIVE (A) NEGATIVE  Iron and TIBC     Status: None   Collection Time: 04/16/19  8:54 PM  Result Value Ref Range   Iron 77 45 - 182 ug/dL   TIBC 266 250 -  450 ug/dL   Saturation Ratios 29 17.9 - 39.5 %   UIBC 189 ug/dL  Ferritin     Status: Abnormal   Collection Time: 04/16/19  8:54 PM  Result Value Ref Range   Ferritin 850 (H) 24 - 336 ng/mL  Reticulocytes     Status: Abnormal   Collection Time: 04/16/19  8:54 PM  Result Value Ref Range   Retic Ct Pct 5.9 (H) 0.4 - 3.1 %   RBC. 1.72 (L) 4.22 - 5.81 MIL/uL   Retic Count, Absolute 101.8 19.0 - 186.0 K/uL   Immature Retic Fract 31.1 (H) 2.3 - 15.9 %  Vitamin B12     Status: None   Collection Time: 04/16/19  8:54 PM  Result Value Ref Range   Vitamin B-12 799 180 - 914 pg/mL  Folate     Status: None   Collection Time: 04/16/19  8:54 PM  Result Value Ref Range   Folate 20.6 >5.9 ng/mL  Phosphorus     Status: None   Collection Time: 04/16/19  8:54 PM  Result Value Ref Range   Phosphorus 2.9 2.5 - 4.6  mg/dL  Type and screen [place]     Status: None (Preliminary result)   Collection Time: 04/16/19  9:42 PM  Result Value Ref Range   ABO/RH(D) B POS    Antibody Screen NEG    Sample Expiration 04/19/2019,2359    Unit Number [id number]    Blood Component Type RED CELLS,LR    Unit division 00    Status of Unit ISSUED    Transfusion Status OK TO TRANSFUSE    Crossmatch Result      Compatible Performed at [facility], [address], [place], [place] [address]    Unit Number [id number]    Blood Component Type RED CELLS,LR    Unit division 00    Status of Unit ISSUED    Transfusion Status OK TO TRANSFUSE    Crossmatch Result Compatible    Unit Number [id number]    Blood Component Type RED CELLS,LR    Unit division 00    Status of Unit ALLOCATED    Transfusion Status OK TO TRANSFUSE    Crossmatch Result Compatible    Unit Number [id number]    Blood Component Type RED CELLS,LR    Unit division 00    Status of Unit ALLOCATED    Transfusion Status OK TO TRANSFUSE    Crossmatch Result Compatible   MRSA PCR Screening     Status: None   Collection Time: 04/17/19  4:54 AM   Specimen: Nasal Mucosa; Nasopharyngeal  Result Value Ref Range   MRSA by PCR NEGATIVE NEGATIVE  Hemoglobin and hematocrit, blood     Status: Abnormal   Collection Time: 04/17/19 10:19 AM  Result Value Ref Range   Hemoglobin 8.5 (L) 13.0 - 17.0 g/dL   HCT 26.3 (L) 39.0 - [id number] %  Basic metabolic panel     Status: Abnormal   Collection Time: 04/17/19 10:19 AM  Result Value Ref Range   Sodium 140 135 - 145 mmol/L   Potassium 4.1 3.5 - 5.1 mmol/L   Chloride 96 (L) 98 - 111 mmol/L   CO2 28 22 - 32 mmol/L   Glucose, Bld 74 70 - 99 mg/dL   BUN 33 (H) 8 - 23 mg/dL   Creatinine, Ser 8.29 (H) 0.61 - 1.24 mg/dL   Calcium 8.8 (L) 8.9 - 10.3 mg/dL   GFR calc non Af  Amer 6 (L) >60 mL/min   GFR calc Af Amer 7 (L) >60 mL/min   Anion gap 16 (H) 5 - 15     DG Chest 1  View  Result Date: 04/16/2019 CLINICAL DATA:  Low hemoglobin EXAM: CHEST  1 VIEW COMPARISON:  06/11/2018 FINDINGS: Moderate cardiomegaly with mild central congestion. No overt edema, pleural effusion or pneumothorax. Aortic atherosclerosis. IMPRESSION: Cardiomegaly with mild central congestion Electronically Signed   By: Donavan Foil M.D.   On: 04/16/2019 19:01    ROS:  As stated above in the HPI otherwise negative.  Blood pressure (!) 156/88, pulse 85, temperature 98.5 F (36.9 C), temperature source Oral, resp. rate 20, height 6' (1.829 m), weight 65.7 kg, SpO2 100 %.    PE: Gen: Agitated, unable to verbalize anything Lungs: CTA Bilaterally CV: RRR without M/G/R ABM: Soft, NTND, +BS Ext: No C/C/E  Assessment/Plan: 1) Symptomatic anemia. 2) ESRD. 3) Heme positive stool. 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   The patient is unable to provide any history.  He is confused and I do not know his baseline level of mentation.  I called his niece Urian Diberardino [phone], but there was no answer.  It appears that his niece takes care of him.  From the GI standpoint he will require further evaluation with an EGD/colonoscopy, but his mentation is so poor that safe endoscopic work up cannot be performed.  He is hemodynamically stable.    Plan: 1) Follow HGB and transfuse as necessary. 2) Follow mentation. 3) Hold any GI work up until his mentation improves/returns to baseline.  Creighton Longley D 04/17/2019, 5:23 PM

## 2019-04-17 NOTE — Progress Notes (Signed)
New Admission Note:  Arrival Method: Stretcher Mental Orientation: Alert to self only Telemetry: Box 07 Afib Assessment: Completed Skin: Warm and dry IV: NSL Pain: Denies Tubes: N/A Safety Measures: Safety Fall Prevention Plan initiated.  Admission: Completed 5 M  Orientation: Patient has been orientated to the room, unit and the staff. Welcome booklet given.  Family: None  Orders have been reviewed and implemented. Will continue to monitor the patient. Call light has been placed within reach and bed alarm has been activated.   Sima Matas BSN, RN  Phone Number: [phone]

## 2019-04-17 NOTE — Progress Notes (Signed)
[facility] Progress Note   Subjective:  Seen in room. Came in last night with severe anemia, + FOBT. S/p 2U PRBCs overnight. Mild confusion today - rambling. Melena odor noted. RUE edematous, on side of AVF which is patent - IR consulted for fistulogram/intervention.  Objective Vitals:   04/17/19 0440 04/17/19 0457 04/17/19 0459 04/17/19 0740  BP: 138/66 (!) 126/55 (!) 126/55 (!) 144/73  Pulse: 100 (!) 51 (!) 51 61  Resp: 16 20 20 20   Temp: 98.3 F (36.8 C)  98 F (36.7 C) 98.4 F (36.9 C)  TempSrc: Oral  Oral   SpO2: 95% 100% 100%   Weight:      Height:       Physical Exam General: Confused man, NAD at the moment. B wrists in soft restraints. Heart: RRR; no murmur Lungs: CTA anteriorly, no rales Abdomen: soft, non-tender Extremities: Profound muscular atrophy, no edema Dialysis Access: RUE AVF + thrill but with diffuse R arm edema  Additional Objective Labs: Basic Metabolic Panel: Recent Labs  Lab 04/16/19 1639 04/16/19 2054  NA 139  --   K 3.8  --   CL 94*  --   CO2 28  --   GLUCOSE 75  --   BUN 29*  --   CREATININE 6.90*  --   CALCIUM 8.4*  --   PHOS  --  2.9   Liver Function Tests: Recent Labs  Lab 04/16/19 1639  AST 19  ALT 13  ALKPHOS 59  BILITOT 0.4  PROT 6.8  ALBUMIN 3.2*   CBC: Recent Labs  Lab 04/16/19 1639  WBC 4.3  NEUTROABS 3.0  HGB 5.4*  HCT 18.3*  MCV 111.6*  PLT PLATELET CLUMPS NOTED ON SMEAR, UNABLE TO ESTIMATE   Iron Studies:  Recent Labs    04/16/19 2054  IRON 77  TIBC 266  FERRITIN 850*   @lablastinr3 @ Studies/Results: DG Chest 1 View  Result Date: 04/16/2019 CLINICAL DATA:  Low hemoglobin EXAM: CHEST  1 VIEW COMPARISON:  06/11/2018 FINDINGS: Moderate cardiomegaly with mild central congestion. No overt edema, pleural effusion or pneumothorax. Aortic atherosclerosis. IMPRESSION: Cardiomegaly with mild central congestion Electronically Signed   By: Donavan Foil M.D.   On: 04/16/2019 19:01    Medications:  . levETIRAcetam  500 mg Oral BID  . pantoprazole (PROTONIX) IV  40 mg Intravenous Q12H  . QUEtiapine  50 mg Oral QHS  . sevelamer carbonate  1,600 mg Oral TID WC   Dialysis Orders: MWF at [facility], 350/800, 2K/2.25Ca, RUE AVF - Venofer 100 x 10 - 8 doses given so far - Mircera 59mcg IV q 2 weeks (given 2/11) - Hectoral 66mcg IV q HD  Hgb trend at HD unit: 10.8 (1/28), 7 (2/4), 6 (2/11), 4.8 (2/15)  Assessment/Plan: 1. Severe anemia (ESRD + GIB): Hgb 5.4 on admit - s/p 2U PRBCs overnight. Repeat Hgb and GI consult pending. Last ESA dose on 2/11, and has recently been getting course of IV iron - tsat 29%. 2. ESRD: Usual MWF schedule - got half of last HD, no acute need today. Next HD planned 2/17 - no heparin. 3. 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 IR consulted for fistulogram 4. HTN/volume: BP stable - home meds on hold. Edema limited to RUE only on exam, although CXR with vascular congestion. 5. Secondary hyperparathyroidism:  Ca/Phos ok. Continue home binders + VDRA 6. Seizure d/c: Continue home meds. 7. AMS/agitation: Presume related to #1, ?cerebral hypoperfusion.  Veneta Penton, PA-C  04/17/2019, 9:10 AM  [facility] Pager: [phone]

## 2019-04-17 NOTE — Progress Notes (Signed)
PROGRESS NOTE    Jared Hancock  [id number] DOB: 08-11-1945 DOA: 04/16/2019 PCP: Rosaria Ferries, MD     Brief Narrative:  Jared Hancock is a 74 y.o. male with medical history significant of ESRD on HD MWF, HTN, HLD, seizure, presented with anemia. Pt went to his regular HD 2/15 and staff found him looked pale and drew blood work. Hb was 4.5 and pt was sent to [facility] ED. Pt was very agitated in the ED but denied any abd pain, no SOB, no chest pain, he also denied any black tarry stool or blood in stool. In the ED, FOBT positive, repeat Hb 5.4. PRBC x2 ordered and GI as well as Nephrology consulted.  New events last 24 hours / Subjective: Confused this morning, unable to participate in adequate review of systems.  Thinks that he is in [place]. Remains in bilateral wrist restraints   Assessment & Plan:   Active Problems:   Iron deficiency anemia due to chronic blood loss   Acute blood loss anemia   Acute blood loss anemia on chronic macrocytic anemia -Most recent hemoglobin in December 2020 (Care Everywhere) was 11.8 -FOBT positive with Hgb 5.4 at time of admission  -Transfused 2 unit packed red blood cells on 2/15 -Dr. Benson Norway, GI, consulted at time of admission -PPI IV -Trend CBC. 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 Patient is from home prior to admission. . Currently in-hospital treatment needed due to  GI evaluation for GI bleed, fistulogram planned with IR 2/17. Marland Kitchen Suspect patient will discharge back home in 2-3 days.    Consultants:   GI  Nephrology  IR   Procedures:   None   Antimicrobials:  Anti-infectives (From admission, onward)   None       Objective: Vitals:   04/17/19 0440 04/17/19 0457 04/17/19 0459 04/17/19 0740  BP: 138/66 (!) 126/55 (!) 126/55 (!) 144/73  Pulse: 100 (!) 51 (!) 51 61  Resp: 16 20 20 20   Temp: 98.3 F (36.8 C)  98 F (36.7 C) 98.4 F (36.9 C)  TempSrc: Oral  Oral   SpO2: 95% 100% 100%   Weight:      Height:        Intake/Output Summary (Last 24 hours) at 04/17/2019 1111 Last data filed at 04/17/2019 0759 Gross per 24 hour  Intake 330 ml  Output -  Net 330 ml   Filed Weights   04/16/19 1629  Weight: 65.7 kg    Examination:  General exam: Appears confused  Respiratory system: Clear to auscultation. Respiratory effort normal. No respiratory distress. No conversational dyspnea.  Cardiovascular system: S1 & S2 heard, RRR. No murmurs. No pedal edema. Gastrointestinal system: Abdomen is nondistended, soft and nontender.  Central nervous system: Alert but not oriented  Extremities: Symmetric in appearance   Data Reviewed: I have personally reviewed following labs and imaging studies  CBC: Recent Labs  Lab 04/16/19 1639 04/17/19 1019  WBC 4.3  --  NEUTROABS 3.0  --   HGB 5.4* 8.5*  HCT 18.3* 26.3*  MCV 111.6*  --   PLT PLATELET CLUMPS NOTED ON SMEAR, UNABLE TO ESTIMATE  --    Basic Metabolic Panel: Recent Labs  Lab 04/16/19 1639 04/16/19 2054  NA 139  --   K 3.8  --   CL 94*  --   CO2 28  --   GLUCOSE 75  --   BUN 29*  --   CREATININE 6.90*  --   CALCIUM 8.4*  --   PHOS  --  2.9   GFR: Estimated Creatinine Clearance: 9.1 mL/min (A) (by C-G formula based on SCr of 6.9 mg/dL (H)). Liver Function Tests: Recent Labs  Lab 04/16/19 1639  AST 19  ALT 13  ALKPHOS 59  BILITOT 0.4  PROT 6.8  ALBUMIN 3.2*    No results for input(s): LIPASE, AMYLASE in the last 168 hours. No results for input(s): AMMONIA in the last 168 hours. Coagulation Profile: No results for input(s): INR, PROTIME in the last 168 hours. Cardiac Enzymes: No results for input(s): CKTOTAL, CKMB, CKMBINDEX, TROPONINI in the last 168 hours. BNP (last 3 results) No results for input(s): PROBNP in the last 8760 hours. HbA1C: No results for input(s): HGBA1C in the last 72 hours. CBG: No results for input(s): GLUCAP in the last 168 hours. Lipid Profile: No results for input(s): CHOL, HDL, LDLCALC, TRIG, CHOLHDL, LDLDIRECT in the last 72 hours. Thyroid Function Tests: No results for input(s): TSH, T4TOTAL, FREET4, T3FREE, THYROIDAB in the last 72 hours. Anemia Panel: Recent Labs    04/16/19 2054  VITAMINB12 799  FOLATE 20.6  FERRITIN 850*  TIBC 266  IRON 77  RETICCTPCT 5.9*   Sepsis Labs: No results for input(s): PROCALCITON, LATICACIDVEN in the last 168 hours.  Recent Results (from the past 240 hour(s))  SARS CORONAVIRUS 2 (TAT 6-24 HRS) Nasopharyngeal Nasopharyngeal Swab     Status: None   Collection Time: 04/16/19  6:16 PM   Specimen: Nasopharyngeal Swab  Result Value Ref Range Status   SARS Coronavirus 2 NEGATIVE NEGATIVE Final    Comment: (NOTE) SARS-CoV-2 target nucleic acids are NOT DETECTED. The SARS-CoV-2 RNA is generally detectable in upper and lower respiratory specimens during the acute phase of infection. Negative results do not preclude SARS-CoV-2 infection, do not rule out co-infections with other pathogens, and should not be used as the sole basis for treatment or other patient management decisions. Negative results must be combined with clinical observations, patient history, and epidemiological information. The expected result is Negative. Fact Sheet for Patients: SugarRoll.be Fact Sheet for Healthcare Providers: https://www.woods-mathews.com/ This test  is not yet approved or cleared by the [place] FDA and  has been authorized for detection and/or diagnosis of SARS-CoV-2 by FDA under an Emergency Use Authorization (EUA). This EUA will remain  in effect (meaning this test can be used) for the duration of the COVID-19 declaration under Section 56 4(b)(1) of the Act, 21 U.S.C. section 360bbb-3(b)(1), unless the authorization is terminated or revoked sooner. Performed at [facility], [place] [address], [place], [place] [address]   MRSA PCR Screening     Status: None   Collection Time: 04/17/19  4:54 AM   Specimen: Nasal Mucosa; Nasopharyngeal  Result Value Ref Range Status   MRSA by PCR NEGATIVE NEGATIVE Final    Comment:        The GeneXpert MRSA Assay (FDA approved for NASAL specimens only), is one component of a  comprehensive MRSA colonization surveillance program. It is not intended to diagnose MRSA infection nor to guide or monitor treatment for MRSA infections. Performed at [facility], [place] [address] [place], [place] [address]       Radiology Studies: DG Chest 1 View  Result Date: 04/16/2019 CLINICAL DATA:  Low hemoglobin EXAM: CHEST  1 VIEW COMPARISON:  06/11/2018 FINDINGS: Moderate cardiomegaly with mild central congestion. No overt edema, pleural effusion or pneumothorax. Aortic atherosclerosis. IMPRESSION: Cardiomegaly with mild central congestion Electronically Signed   By: Donavan Foil M.D.   On: 04/16/2019 19:01      Scheduled Meds: . Chlorhexidine Gluconate Cloth  6 each Topical Q0600  . [START ON 04/18/2019] diphenhydrAMINE  50 mg Oral Once   Or  . [START ON 04/18/2019] diphenhydrAMINE  50 mg Intravenous Once  . levETIRAcetam  500 mg Oral BID  . pantoprazole (PROTONIX) IV  40 mg Intravenous Q12H  . predniSONE  50 mg Oral Q6H  . QUEtiapine  50 mg Oral QHS  . sevelamer carbonate  1,600 mg Oral TID WC   Continuous Infusions:   LOS: 1 day      Time spent: 35 minutes   Dessa Phi,  DO [facility] 04/17/2019, 11:11 AM   Available via Epic secure chat 7am-7pm After these hours, please refer to coverage provider listed on [email or web address]

## 2019-04-18 ENCOUNTER — Inpatient Hospital Stay (HOSPITAL_COMMUNITY): Payer: Medicare Other

## 2019-04-18 DIAGNOSIS — D500 Iron deficiency anemia secondary to blood loss (chronic): Secondary | ICD-10-CM

## 2019-04-18 LAB — CBC
HCT: 30.7 % — ABNORMAL LOW (ref 39.0–52.0)
Hemoglobin: 9.7 g/dL — ABNORMAL LOW (ref 13.0–17.0)
MCH: 31.2 pg (ref 26.0–34.0)
MCHC: 31.6 g/dL (ref 30.0–36.0)
MCV: 98.7 fL (ref 80.0–100.0)
Platelets: 108 10*3/uL — ABNORMAL LOW (ref 150–400)
RBC: 3.11 MIL/uL — ABNORMAL LOW (ref 4.22–5.81)
RDW: 23 % — ABNORMAL HIGH (ref 11.5–15.5)
WBC: 2 10*3/uL — ABNORMAL LOW (ref 4.0–10.5)
nRBC: 0 % (ref 0.0–0.2)

## 2019-04-18 LAB — BASIC METABOLIC PANEL
Anion gap: 19 — ABNORMAL HIGH (ref 5–15)
BUN: 46 mg/dL — ABNORMAL HIGH (ref 8–23)
CO2: 24 mmol/L (ref 22–32)
Calcium: 9.1 mg/dL (ref 8.9–10.3)
Chloride: 96 mmol/L — ABNORMAL LOW (ref 98–111)
Creatinine, Ser: 9.75 mg/dL — ABNORMAL HIGH (ref 0.61–1.24)
GFR calc Af Amer: 6 mL/min — ABNORMAL LOW (ref >60)
GFR calc non Af Amer: 5 mL/min — ABNORMAL LOW (ref >60)
Glucose, Bld: 105 mg/dL — ABNORMAL HIGH (ref 70–99)
Potassium: 4.9 mmol/L (ref 3.5–5.1)
Sodium: 139 mmol/L (ref 135–145)

## 2019-04-18 LAB — AMMONIA: Ammonia: 19 umol/L (ref 9–35)

## 2019-04-18 MED ORDER — LORAZEPAM 1 MG PO TABS
1.0000 mg | ORAL_TABLET | ORAL | Status: DC | PRN
  Administered 2019-04-19 – 2019-04-22 (×3): 1 mg via ORAL
  Filled 2019-04-18 (×3): qty 1

## 2019-04-18 MED ORDER — ACETAMINOPHEN 325 MG PO TABS
650.0000 mg | ORAL_TABLET | Freq: Four times a day (QID) | ORAL | Status: DC | PRN
  Administered 2019-04-18 – 2019-04-21 (×3): 650 mg via ORAL
  Filled 2019-04-18 (×2): qty 2

## 2019-04-18 NOTE — Progress Notes (Signed)
PROGRESS NOTE  Jared Hancock  DOB: 18-Apr-1945  PCP: Rosaria Ferries, MD IN:[id number]  DOA: 04/16/2019 Admitted From: Home  LOS: 2 days   Chief Complaint  Patient presents with  . Abnormal Lab   Brief narrative: Jared Hancock is a 74 y.o.malewith medical history significant ofESRD on HD MWF, HTN, HLD, seizure, presented with anemia. Pt went to his regular HD 2/15 and staff found him looked pale and drew blood work. Hb was 4.5 and pt was sent to [facility] ED. Pt was very agitated in the ED but denied any abd pain, no SOB, no chest pain, he also denied any black tarry stool or blood in stool. In the ED, FOBT positive, repeat Hb 5.4. PRBC x2 ordered and GI as well as Nephrology consulted.  Subjective: Patient was seen and examined this morning.  Elderly African-American male, lying in bed.  Wrist restraints noted.  Remains confused.  Patient tried but unable to answer any orientation questions.  Not in physical distress at this time.  Assessment/Plan: Acute blood loss anemia on chronic macrocytic anemia -Most recent hemoglobin in December 2020 (Care Everywhere) was 11.8 -FOBT positive with Hgb 5.4 at time of admission  -Transfused 2 unit packed red blood cells on 2/15 -Dr. Benson Norway, GI, consulted at time of admission -PPI IV -Hemoglobin improved to 9.7 this morning. -GI is holding off on intervention at this time because of patient's altered mental status.    Acute metabolic encephalopathy Underlying dementia -Unclear cause of acute confusion.  At this time patient is not able to answer any questions and also has bilateral wrist restraints.  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 -Obtain ammonia level.  ESRD -On hemodialysis MWF -Nephrology following  Chronic diastolic heart failure -Stable.  EF 55 to 60%.  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  Currently in-hospital treatment needed due to GI evaluation for GI bleed, fistulogram planned with IR 2/17.  He is due for further GI evaluation once mental status improves.  Anticipate patient will discharge back home in 2-3 days.   Consultants:   GI  Nephrology  IR   Antimicrobials: Anti-infectives (From admission, onward)   None        Code Status: Full Code   Diet Order            Diet NPO time specified Except for: Sips with Meds  Diet effective now              Infusions:    Scheduled Meds: . Chlorhexidine Gluconate Cloth  6 each Topical Q0600  . diphenhydrAMINE  50 mg Oral Once   Or  . diphenhydrAMINE  50 mg Intravenous Once  . levETIRAcetam  500 mg Oral BID  . pantoprazole (PROTONIX) IV  40 mg Intravenous Q12H  . QUEtiapine  50 mg Oral QHS  . sevelamer carbonate  1,600 mg Oral TID WC    PRN meds: hydrALAZINE, ipratropium, LORazepam, LORazepam   Objective: Vitals:   04/18/19 0457 04/18/19 0900  BP: (!) 150/75 139/73  Pulse: 79 72  Resp: 14 16  Temp: (!) 97.5 F (36.4 C) (!) 97.5 F (36.4 C)  SpO2: (!) 75% 99%    Intake/Output Summary (Last 24 hours) at 04/18/2019 1110  Last data filed at 04/18/2019 0900 Gross per 24 hour  Intake 0 ml  Output 0 ml  Net 0 ml   Filed Weights   04/16/19 1629  Weight: 65.7 kg   Weight change:  Body mass index is 19.64 kg/m.   Physical Exam: General exam: Lying down in bed.  Confused.  Not in physical distress Skin: No rashes, lesions or ulcers. HEENT: Atraumatic, normocephalic, supple neck, no obvious bleeding Lungs: Clear to auscultate bilaterally CVS: Regular rate and rhythm, no murmur GI/Abd soft, nontender, nondistended, bowel sound present CNS: Confused, tries but unable to answer any orientation questions.   Psychiatry:  Unable to examine Extremities: No pedal edema, no calf tenderness  Data Review: I have personally reviewed the laboratory data and studies available.  Recent Labs  Lab 04/16/19 1639 04/17/19 1019 04/18/19 0603  WBC 4.3  --  2.0*  NEUTROABS 3.0  --   --   HGB 5.4* 8.5* 9.7*  HCT 18.3* 26.3* 30.7*  MCV 111.6*  --  98.7  PLT PLATELET CLUMPS NOTED ON SMEAR, UNABLE TO ESTIMATE  --  108*   Recent Labs  Lab 04/16/19 1639 04/16/19 2054 04/17/19 1019 04/18/19 0603  NA 139  --  140 139  K 3.8  --  4.1 4.9  CL 94*  --  96* 96*  CO2 28  --  28 24  GLUCOSE 75  --  74 105*  BUN 29*  --  33* 46*  CREATININE 6.90*  --  8.29* 9.75*  CALCIUM 8.4*  --  8.8* 9.1  PHOS  --  2.9  --   --    Recent Labs  Lab 04/16/19 1639  AST 19  ALT 13  ALKPHOS 59  BILITOT 0.4  PROT 6.8  ALBUMIN 3.2*    Terrilee Croak, MD  [facility] 04/18/2019

## 2019-04-18 NOTE — Progress Notes (Signed)
UNASSIGNED PATIENT Subjective: 74 year old black male with a history of ESRD on HD, HTN, HLD presented to the hospital with anemia and guaiac postive stools. He has received 2 units of PBC's. He has been very agitated and combative off and on,.He is currently on the bedside commode trying to have a BM.  Objective: Vital signs in last 24 hours: Temp:  [97.4 F (36.3 C)-98.5 F (36.9 C)] 97.6 F (36.4 C) (02/17 1247) Pulse Rate:  [50-84] 53 (02/17 1247) Resp:  [14-18] 17 (02/17 1247) BP: (139-154)/(54-89) 149/61 (02/17 1247) SpO2:  [75 %-99 %] 99 % (02/17 1247) Last BM Date: 04/16/19  Intake/Output from previous day: 02/16 0701 - 02/17 0700 In: 330 [Blood:330] Out: 0  Intake/Output this shift: No intake/output data recorded.  General appearance: appears stated age, fatigued and slowed mentation, confused  Resp: clear to auscultation bilaterally Cardio: regular rate and rhythm, S1, S2 normal, no murmur, click, rub or gallop GI: soft, non-tender; bowel sounds normal; no masses,  no organomegaly Extremities: extremities normal, atraumatic, no cyanosis or edema  Lab Results: Recent Labs    04/16/19 1639 04/17/19 1019 04/18/19 0603  WBC 4.3  --  2.0*  HGB 5.4* 8.5* 9.7*  HCT 18.3* 26.3* 30.7*  PLT PLATELET CLUMPS NOTED ON SMEAR, UNABLE TO ESTIMATE  --  108*   BMET Recent Labs    04/16/19 1639 04/17/19 1019 04/18/19 0603  NA 139 140 139  K 3.8 4.1 4.9  CL 94* 96* 96*  CO2 28 28 24   GLUCOSE 75 74 105*  BUN 29* 33* 46*  CREATININE 6.90* 8.29* 9.75*  CALCIUM 8.4* 8.8* 9.1   LFT Recent Labs    04/16/19 1639  PROT 6.8  ALBUMIN 3.2*  AST 19  ALT 13  ALKPHOS 59  BILITOT 0.4   Studies/Results: DG Chest 1 View  Result Date: 04/16/2019 CLINICAL DATA:  Low hemoglobin EXAM: CHEST  1 VIEW COMPARISON:  06/11/2018 FINDINGS: Moderate cardiomegaly with mild central congestion. No overt edema, pleural effusion or pneumothorax. Aortic atherosclerosis. IMPRESSION:  Cardiomegaly with mild central congestion Electronically Signed   By: Donavan Foil M.D.   On: 04/16/2019 19:01   Medications: I have reviewed the patient's current medications.  Assessment/Plan: 1) Anemia with FOBT positive stools-patient is not at a point where endoscopic procedures can be done. Will monitor him closely. Hemoglobin is 9.7 gms/dl. 2) ?Acute metabolic encephalopathy/dementia. 3) History of HCV cirrhosis. 4) ESRD on HD. 5) Chronic diastolic heart failure. 6) History of seizure disorder-on Keppra.  LOS: 2 days   Juanita Craver 04/18/2019, 1:17 PM

## 2019-04-18 NOTE — Plan of Care (Signed)
  Problem: Education: Goal: Knowledge of General Education information will improve Description: Including pain rating scale, medication(s)/side effects and non-pharmacologic comfort measures Outcome: Not Progressing   
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
## 2019-04-18 NOTE — Progress Notes (Signed)
[facility] Progress Note   Subjective:  Seen in room - calm this morning. Per notes, was very confused and agitated overnight. Plan is for fistulogram this morning with IR. GI consulted - felt unsafe for procedure given MS yesterday - will revisit today.  Objective Vitals:   04/17/19 1729 04/17/19 2144 04/18/19 0457 04/18/19 0900  BP: (!) 142/54 (!) 154/89 (!) 150/75 139/73  Pulse: (!) 50 84 79 72  Resp: 14 18 14 16   Temp: (!) 97.5 F (36.4 C) (!) 97.4 F (36.3 C) (!) 97.5 F (36.4 C) (!) 97.5 F (36.4 C)  TempSrc: Oral Oral Oral Oral  SpO2: 99% 99% (!) 75% 99%  Weight:      Height:       Physical Exam General: Calm and able to answer basic questions today. B wrists in soft restraints. Heart: RRR; no murmur Lungs: CTA anteriorly, no rales Abdomen: soft, non-tender Extremities: Profound muscular atrophy, no edema Dialysis Access: RUE AVF + thrill but with diffuse R arm edema to neck/face  Additional Objective Labs: Basic Metabolic Panel: Recent Labs  Lab 04/16/19 1639 04/16/19 2054 04/17/19 1019 04/18/19 0603  NA 139  --  140 139  K 3.8  --  4.1 4.9  CL 94*  --  96* 96*  CO2 28  --  28 24  GLUCOSE 75  --  74 105*  BUN 29*  --  33* 46*  CREATININE 6.90*  --  8.29* 9.75*  CALCIUM 8.4*  --  8.8* 9.1  PHOS  --  2.9  --   --    Liver Function Tests: Recent Labs  Lab 04/16/19 1639  AST 19  ALT 13  ALKPHOS 59  BILITOT 0.4  PROT 6.8  ALBUMIN 3.2*   CBC: Recent Labs  Lab 04/16/19 1639 04/17/19 1019 04/18/19 0603  WBC 4.3  --  2.0*  NEUTROABS 3.0  --   --   HGB 5.4* 8.5* 9.7*  HCT 18.3* 26.3* 30.7*  MCV 111.6*  --  98.7  PLT PLATELET CLUMPS NOTED ON SMEAR, UNABLE TO ESTIMATE  --  108*   Iron Studies:  Recent Labs    04/16/19 2054  IRON 77  TIBC 266  FERRITIN 850*   Studies/Results: DG Chest 1 View  Result Date: 04/16/2019 CLINICAL DATA:  Low hemoglobin EXAM: CHEST  1 VIEW COMPARISON:  06/11/2018 FINDINGS: Moderate cardiomegaly  with mild central congestion. No overt edema, pleural effusion or pneumothorax. Aortic atherosclerosis. IMPRESSION: Cardiomegaly with mild central congestion Electronically Signed   By: Donavan Foil M.D.   On: 04/16/2019 19:01   Medications:  . Chlorhexidine Gluconate Cloth  6 each Topical Q0600  . diphenhydrAMINE  50 mg Oral Once   Or  . diphenhydrAMINE  50 mg Intravenous Once  . levETIRAcetam  500 mg Oral BID  . pantoprazole (PROTONIX) IV  40 mg Intravenous Q12H  . QUEtiapine  50 mg Oral QHS  . sevelamer carbonate  1,600 mg Oral TID WC    Dialysis Orders: MWF at [facility], 350/800, 2K/2.25Ca, RUE AVF - Venofer 100 x 10 - 8 doses given so far - Mircera 54mcg IV q 2 weeks (given 2/11) - Hectoral 28mcg IV q HD  Hgb trend at HD unit: 10.8 (1/28), 7 (2/4), 6 (2/11), 4.8 (2/15)  Assessment/Plan: 1. Severe anemia (ESRD + GIB): Hgb 5.4 on admit - s/p 2U PRBCs. Has recently been getting course of IV iron - tsat 29% and ESA at outpt clinic - Hgb  up to 9.7 today. GI following. 2. ESRD: Usual MWF schedule. HD planned for today s/p IR procedure - no heparin. 3. 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 IR consulted for fistulogram 4. HTN/volume: BP stable - home meds on hold. Edema limited to RUE/neck/face only on exam, although CXR with vascular congestion. 5. Secondary hyperparathyroidism:  Ca/Phos ok. Continue home binders + VDRA 6. Seizure d/c: Continue home meds. 7. AMS/agitation: Unclear baseline. ?cerebral hypoperfusion. Consider imaging.  Veneta Penton, PA-C 04/18/2019, 9:43 AM  [facility] Pager: [phone]

## 2019-04-18 NOTE — Progress Notes (Signed)
Interventional Radiology Brief Note:  IR planning for fistulagram today. He is combative this AM and refusing his benadryl.   Per RN, patient will not take anything PO and is not allowing staff to use his IVs although they are in working order.  Patient does have a contrast allergy and was premedicated with prednisone yesterday, however appears orders were altered/replaced yesterday and were not given as prescribed by radiology.  Now that he is refusing medications this AM, he is likely to be outside window to receive contrast today.  Appears patient with altered mental status yesterday as well.    Procedure today will require patient's cooperation which is not likely.  Per GI, mental status also limiting ability to proceed with EGD.  He was able to get a half session of dialysis on Monday.  Labs reviewed.   Discussed with Nephrology PA.   Brynda Greathouse, MS RD PA-C 11:56 AM

## 2019-04-18 NOTE — Progress Notes (Signed)
Attempted to send for pt to come to IR for procedure. Pt has not had premedication for procedure. RN on floor also states that pt is combative and refusing treatment at this time. IR MD and PA aware.

## 2019-04-18 NOTE — Plan of Care (Signed)
Plan of care reviewed with pt. Pt combative at times. Soft restraints in use and protocol followed. Pt takes PO meds well. Pt screams and shouts and tries to swing arms when IV is used. RN was unable to give any IV medications. IR was notified and procedure was cancelled. HD PA also aware. HD unsuccessful as well.  Right UA swollen. Education provided. Safety/fall bundle in place. Call bell in reach. Will continue to monitor pt.   Problem: Education: Goal: Knowledge of General Education information will improve Description: Including pain rating scale, medication(s)/side effects and non-pharmacologic comfort measures Outcome: Not Progressing   Problem: Health Behavior/Discharge Planning: Goal: Ability to manage health-related needs will improve Outcome: Not Progressing   Problem: Clinical Measurements: Goal: Ability to maintain clinical measurements within normal limits will improve Outcome: Not Progressing Goal: Will remain free from infection Outcome: Not Progressing Goal: Diagnostic test results will improve Outcome: Not Progressing Goal: Respiratory complications will improve Outcome: Not Progressing Goal: Cardiovascular complication will be avoided Outcome: Not Progressing   Problem: Activity: Goal: Risk for activity intolerance will decrease Outcome: Not Progressing   Problem: Nutrition: Goal: Adequate nutrition will be maintained Outcome: Not Progressing   Problem: Coping: Goal: Level of anxiety will decrease Outcome: Not Progressing   Problem: Elimination: Goal: Will not experience complications related to bowel motility Outcome: Not Progressing Goal: Will not experience complications related to urinary retention Outcome: Not Progressing   Problem: Pain Managment: Goal: General experience of comfort will improve Outcome: Not Progressing   Problem: Safety: Goal: Ability to remain free from injury will improve Outcome: Not Progressing   Problem: Skin  Integrity: Goal: Risk for impaired skin integrity will decrease Outcome: Not Progressing   Problem: Education: Goal: Knowledge of disease and its progression will improve Outcome: Not Progressing Goal: Individualized Educational Video(s) Outcome: Not Progressing   Problem: Fluid Volume: Goal: Compliance with measures to maintain balanced fluid volume will improve Outcome: Not Progressing   Problem: Health Behavior/Discharge Planning: Goal: Ability to manage health-related needs will improve Outcome: Not Progressing   Problem: Nutritional: Goal: Ability to make healthy dietary choices will improve Outcome: Not Progressing   Problem: Clinical Measurements: Goal: Complications related to the disease process, condition or treatment will be avoided or minimized Outcome: Not Progressing

## 2019-04-19 LAB — CBC WITH DIFFERENTIAL/PLATELET
Abs Immature Granulocytes: 0.02 10*3/uL (ref 0.00–0.07)
Basophils Absolute: 0 10*3/uL (ref 0.0–0.1)
Basophils Relative: 0 %
Eosinophils Absolute: 0.1 10*3/uL (ref 0.0–0.5)
Eosinophils Relative: 1 %
HCT: 24.6 % — ABNORMAL LOW (ref 39.0–52.0)
Hemoglobin: 7.7 g/dL — ABNORMAL LOW (ref 13.0–17.0)
Immature Granulocytes: 0 %
Lymphocytes Relative: 10 %
Lymphs Abs: 0.5 10*3/uL — ABNORMAL LOW (ref 0.7–4.0)
MCH: 31 pg (ref 26.0–34.0)
MCHC: 31.3 g/dL (ref 30.0–36.0)
MCV: 99.2 fL (ref 80.0–100.0)
Monocytes Absolute: 0.4 10*3/uL (ref 0.1–1.0)
Monocytes Relative: 8 %
Neutro Abs: 3.9 10*3/uL (ref 1.7–7.7)
Neutrophils Relative %: 81 %
Platelets: 96 10*3/uL — ABNORMAL LOW (ref 150–400)
RBC: 2.48 MIL/uL — ABNORMAL LOW (ref 4.22–5.81)
RDW: 22.2 % — ABNORMAL HIGH (ref 11.5–15.5)
WBC: 4.9 10*3/uL (ref 4.0–10.5)
nRBC: 0 % (ref 0.0–0.2)

## 2019-04-19 LAB — BASIC METABOLIC PANEL
Anion gap: 17 — ABNORMAL HIGH (ref 5–15)
BUN: 65 mg/dL — ABNORMAL HIGH (ref 8–23)
CO2: 26 mmol/L (ref 22–32)
Calcium: 7.9 mg/dL — ABNORMAL LOW (ref 8.9–10.3)
Chloride: 94 mmol/L — ABNORMAL LOW (ref 98–111)
Creatinine, Ser: 11.33 mg/dL — ABNORMAL HIGH (ref 0.61–1.24)
GFR calc Af Amer: 5 mL/min — ABNORMAL LOW (ref >60)
GFR calc non Af Amer: 4 mL/min — ABNORMAL LOW (ref >60)
Glucose, Bld: 80 mg/dL (ref 70–99)
Potassium: 4.6 mmol/L (ref 3.5–5.1)
Sodium: 137 mmol/L (ref 135–145)

## 2019-04-19 LAB — GLUCOSE, CAPILLARY
Glucose-Capillary: 66 mg/dL — ABNORMAL LOW (ref 70–99)
Glucose-Capillary: 103 mg/dL — ABNORMAL HIGH (ref 70–99)

## 2019-04-19 LAB — AMMONIA: Ammonia: 38 umol/L — ABNORMAL HIGH (ref 9–35)

## 2019-04-19 NOTE — Progress Notes (Signed)
Pt became increasingly agitated after IV team consult and was yelling out. Pt was given 1mg  of ativan. RN will continue to assess

## 2019-04-19 NOTE — Clinical Social Work Note (Signed)
CSW advised by MD that patient's niece Evyn Torbeck and sister [name] were in agreement with hospice services for patient. Call made to niece and she requested a facility in [place] area. Call made to [facility] [place] [facility] and talked with Debbie. CSW provided with cost information for inpatient hospice: $225 per day with 2 weeks paid up-front, then weekly thereafter; and at the end of 6 weeks, there must be a d/c plan in place. This information provided to Ms. Cutchin and was advised that patient's income would not cover this daughter. Niece informed CSW that she really does not want to be involved as is not close with her uncle and does not want the responsibility and is not paying for anything. CSW expressed understanding and her feelings discussed. Ms. Ostrand also informed CSW that her aunt [name] is older that patient and is also not close with her brother.   Ms. Liesch was agreeable to CSW contact other hospice facilities and to also determine how they would be paid. Contact made with Bevely Palmer with [facility] and referral made. Bevely Palmer was informed about niece's concerns and reported that she would call niece and talk with her. CSW advised later by a colleague that Bevely Palmer contacted her and informed her that they do not currently have a bed and daughter refused to sign any paperwork. Cheri with [facility] also contacted and referral made. CSW advised that they do not currently have any beds. CSW will follow-up with niece regarding a discharge plan for her uncle.  Genine Beckett Givens, MSW, LCSW Licensed Clinical Social Worker [place] [phone]

## 2019-04-19 NOTE — Progress Notes (Signed)
Received phone call at the HD unit from patient's sister - Darail Neubauer.  She was relayed the status of his condition/hospitalization by [place]. She agrees with transition to DNR and hospice. Called Mr. Grunow's hospitalist - Dr. Pietro Cassis - to inform of the discussion.   The issue is that he doesn't have a clear POA here (per sister he refused to set one up in the past) - may need to contact his SNF to see who they have on file - suspect [name].   Dr. Pietro Cassis will call the sister or niece to discuss further.  Veneta Penton, PA-C [employer] Pager [phone]

## 2019-04-19 NOTE — Progress Notes (Signed)
[facility] Progress Note   Subjective: Seen in room - somewhat sedated this morning. See yesterday's note - combative yesterday with RN and when brought for dialysis - unable to be dialyzed. He doesn't really have a useable HD access at this time, IR procedure cancelled yesterday d/t agitation and incomplete pre-medication for contrast allergy. R arm remains diffusely edematous. Hgb down and not stable enough for GI procedures. Known dementia, but in discussions yesterday it appears that more severe than originally thought - apparently lives in locked memory care unit at his SNF. Attempted to call family yesterday to discuss goals - left messages/didn't hear back.   Tried niece [name]) again today and was able to reach her on her cell phone. Discussed current situation - worsening dementia with inability to treat him aggressively with dialysis, GI procedure, IR procedure, etc. She had no clue about all of this -- hasn't seen him in a while. We discussed strongly considering transition to hospice and discontinuing aggressive treatments. She reports that he has 2 sisters and a brother - all elderly with various health issues - but wants to call to speak to them and then will call back - phone #s provided to her including Dr. Judie Bonus pager and the dialysis unit main number where she likely can reach myself.  Objective Vitals:   04/18/19 1247 04/18/19 1634 04/18/19 2043 04/19/19 0507  BP: (!) 149/61 (!) 142/68 (!) 120/52 (!) 142/56  Pulse: (!) 53 (!) 53 62 (!) 58  Resp: 17 18 17 15   Temp: 97.6 F (36.4 C) 97.8 F (36.6 C) (!) 97.5 F (36.4 C) 98.2 F (36.8 C)  TempSrc: Oral Oral Oral   SpO2: 99% 98% 100% 100%  Weight:   65.7 kg   Height:       Physical Exam General:Calm at the moment, opens eyes but not verbal to me. B wrists in soft restraints. Heart:RRR; no murmur Lungs:CTA anteriorly, no rales Abdomen:soft, non-tender Extremities:Profound muscular atrophy, no  edema Dialysis Access:RUE AVF + thrill but with diffuse R arm edema to neck.  Additional Objective Labs: Basic Metabolic Panel: Recent Labs  Lab 04/16/19 1639 04/16/19 2054 04/17/19 1019 04/18/19 0603 04/19/19 0404  NA   < >  --  140 139 137  K   < >  --  4.1 4.9 4.6  CL   < >  --  96* 96* 94*  CO2   < >  --  28 24 26   GLUCOSE   < >  --  74 105* 80  BUN   < >  --  33* 46* 65*  CREATININE   < >  --  8.29* 9.75* 11.33*  CALCIUM   < >  --  8.8* 9.1 7.9*  PHOS  --  2.9  --   --   --    < > = values in this interval not displayed.   Liver Function Tests: Recent Labs  Lab 04/16/19 1639  AST 19  ALT 13  ALKPHOS 59  BILITOT 0.4  PROT 6.8  ALBUMIN 3.2*   CBC: Recent Labs  Lab 04/16/19 1639 04/16/19 1639 04/17/19 1019 04/18/19 0603 04/19/19 0404  WBC 4.3  --   --  2.0* 4.9  NEUTROABS 3.0  --   --   --  3.9  HGB 5.4*   < > 8.5* 9.7* 7.7*  HCT 18.3*   < > 26.3* 30.7* 24.6*  MCV 111.6*  --   --  98.7 99.2  PLT  PLATELET CLUMPS NOTED ON SMEAR, UNABLE TO ESTIMATE  --   --  108* 96*   < > = values in this interval not displayed.   Iron Studies:  Recent Labs    04/16/19 2054  IRON 77  TIBC 266  FERRITIN 850*   Medications:  . Chlorhexidine Gluconate Cloth  6 each Topical Q0600  . diphenhydrAMINE  50 mg Oral Once   Or  . diphenhydrAMINE  50 mg Intravenous Once  . levETIRAcetam  500 mg Oral BID  . pantoprazole (PROTONIX) IV  40 mg Intravenous Q12H  . QUEtiapine  50 mg Oral QHS  . sevelamer carbonate  1,600 mg Oral TID WC    Dialysis Orders: MWF at [facility], 350/800, 2K/2.25Ca, RUE AVF - Venofer 100 x 10 - 8 doses given so far - Mircera 30mcg IV q 2 weeks (given 2/11) - Hectoral 43mcg IV q HD  Hgb trend at HD unit: 10.8 (1/28), 7 (2/4), 6 (2/11), 4.8 (2/15)  Assessment/Plan: 1.Severe anemia (ESRD + GIB): Hgb 5.4 on admit - s/p 2U PRBCs. Has recently been getting course of IV iron - tsat 29% and ESA at outpt clinic - Hgb up to 9.7 -> low again. GI  following but agitation has prevented procedural evaluation. 2. ESRD:Usual MWF schedule. Unable to be dialyzed on 2/17 d/t agitation - unclear if AVF even useable at this point, didn't get that far. 3. 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 IR consulted for fistulogram - cancelled d/t agitation. 4.HTN/volume:BP stable - home meds on hold. Edema limited to RUE/neck/face only on exam, although CXR with vascular congestion. 5. Secondary hyperparathyroidism:Ca/Phos ok. Continue home binders + VDRA 6.Seizure d/c:Continue home meds. 7. AMS/agitation: Baseline was not known initially -> after investigation, appears that he has chronic severe dementia - lives in locked memory care until at his SNF. Hx combative behavior. Requiring sedating meds here, but still not calm enough to properly treat him. Finally able to reach his niece this morning - see above discussion. Discussed transition to hospice -> she plans to discuss with his siblings and call back.   Veneta Penton, PA-C 04/19/2019, 9:03 AM  [facility] Pager: [phone]

## 2019-04-19 NOTE — TOC Progression Note (Signed)
Transition of Care [facility]) - Progression Note    Patient Details  Name: Jared Hancock MRN: [id number] Date of Birth: Sep 14, 1945  [facility] CM/SW Contact  Bartholomew Crews, RN Phone Number: [phone] 04/19/2019, 2:08 PM  Clinical Narrative:    [facility] consult for hospice bed. Advised by palliative to move forward with this. Spoke with patient's niece, Jared Hancock, about hospice inpatient facility. Jared Hancock has no preference for location of facilty. Referral to [facility] for residential hospice pending. TOC following for transition needs.    Expected Discharge Plan: [place] Barriers to Discharge: Continued Medical Work up  Expected Discharge Plan and Services Expected Discharge Plan: [place] In-house Referral: Clinical Social Work Discharge Planning Services: Other - See comment(Back to SNF)   Living arrangements for the past 2 months: Skilled Nursing Facility([facility])                                       Social Determinants of Health (SDOH) Interventions    Readmission Risk Interventions No flowsheet data found.

## 2019-04-19 NOTE — Plan of Care (Signed)
  Problem: Nutrition: Goal: Adequate nutrition will be maintained Outcome: Progressing   

## 2019-04-19 NOTE — Progress Notes (Signed)
PROGRESS NOTE  Jared Hancock  DOB: 09/09/1945  PCP: Rosaria Ferries, MD IN:[id number]  DOA: 04/16/2019 Admitted From: Home  LOS: 3 days   Chief Complaint  Patient presents with  . Abnormal Lab   Brief narrative: Jared Hancock is a 74 y.o.malewith medical history significant ofESRD on HD MWF, HTN, HLD, seizure, presented with anemia. Pt went to his regular HD 2/15 and staff found him looked pale and drew blood work. Hb was 4.5 and pt was sent to [facility] ED. Pt was very agitated in the ED but denied any abd pain, no SOB, no chest pain, he also denied any black tarry stool or blood in stool. In the ED, FOBT positive, repeat Hb 5.4. PRBC x2 ordered and GI as well as Nephrology consulted.  Subjective: Patient was seen and examined this morning.  Elderly African-American male, lying in bed.  Wrist restraints noted.  Remains confused.  Yesterday he tried to answer my questions.  Today he was more lethargic.    Assessment/Plan: Acute blood loss anemia on chronic macrocytic anemia -Most recent hemoglobin in December 2020 (Care Everywhere) was 11.8 -FOBT positive with Hgb 5.4 at time of admission  -Transfused 2 unit packed red blood cells on 2/15 -Dr. Benson Norway, GI, consulted at time of admission -Currently on PPI. -Hemoglobin was 9.7 yesterday, down to 7.7 again today. -GI is holding off on intervention at this time because of patient's altered mental status.    Acute metabolic encephalopathy Underlying dementia -Unclear cause of acute confusion.  At this time patient is not able to answer any questions and also has bilateral wrist restraints. -Seems to be worsening.  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 -Ammonia level not so elevated.  62 yesterday, but increasing, 38 today.  ESRD -On hemodialysis MWF -Nephrology following  Chronic diastolic heart failure -Stable.  EF 55 to 60%.  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  Seizure disorder -Continue Keppra  DVT prophylaxis:  SCD Antimicrobials:  None Fluid: None Diet: Awake enough to eat  Code Status:   Based on conversation with family this morning, also history DNR status. Mobility: Impaired Family Communication: I discussed with patient's niece MS. Marline Backbone.  Patient does not have legal power of attorney.  She and patient's sister [name] are in agreement to hospice services.  She is expecting a discharge to a hospice facility.  Discharge plan:  Anticipated date: Whenever hospice can take him Disposition: Family expects hospice facility Barriers: Pending evaluation by hospice.  Consult placed.  Consultants:   GI  Nephrology  IR   Antimicrobials: Anti-infectives (From admission, onward)   None        Code Status: Full Code   Diet Order            Diet renal/carb modified with fluid restriction Diet-HS Snack? Nothing; Fluid restriction: 1200 mL Fluid; Room service appropriate? No; Fluid consistency: Thin  Diet effective now              Infusions:    Scheduled Meds: . Chlorhexidine Gluconate Cloth  6 each Topical Q0600  . diphenhydrAMINE  50 mg Oral Once   Or  . diphenhydrAMINE  50 mg Intravenous Once  . levETIRAcetam  500 mg Oral BID  . pantoprazole (PROTONIX) IV  40 mg Intravenous Q12H  . QUEtiapine  50 mg Oral QHS  . sevelamer carbonate  1,600 mg Oral TID WC  PRN meds: acetaminophen, hydrALAZINE, ipratropium, LORazepam, LORazepam   Objective: Vitals:   04/19/19 0507 04/19/19 0822  BP: (!) 142/56 (!) 148/59  Pulse: (!) 58 66  Resp: 15 17  Temp: 98.2 F (36.8 C) 98.3 F (36.8 C)  SpO2: 100% 98%    Intake/Output Summary (Last 24 hours) at 04/19/2019 1215 Last data filed at 04/19/2019 0539 Gross per 24 hour  Intake 200 ml  Output 0 ml  Net 200 ml   Filed Weights   04/16/19 1629 04/18/19 2043  Weight: 65.7 kg 65.7 kg   Weight change:  Body mass  index is 19.64 kg/m.   Physical Exam: General exam: Lying down in bed. Confused, minimally arousable.  Not in physical distress.  On bilateral wrist restraints Skin: No rashes, lesions or ulcers. HEENT: Atraumatic, normocephalic, supple neck, no obvious bleeding Lungs: Clear to auscultate bilaterally CVS: Regular rate and rhythm, no murmur GI/Abd soft, nontender, nondistended, bowel sound present CNS: Confused, on wrist restraints Psychiatry: Unable to examine Extremities: No pedal edema, no calf tenderness  Data Review: I have personally reviewed the laboratory data and studies available.  Recent Labs  Lab 04/16/19 1639 04/17/19 1019 04/18/19 0603 04/19/19 0404  WBC 4.3  --  2.0* 4.9  NEUTROABS 3.0  --   --  3.9  HGB 5.4* 8.5* 9.7* 7.7*  HCT 18.3* 26.3* 30.7* 24.6*  MCV 111.6*  --  98.7 99.2  PLT PLATELET CLUMPS NOTED ON SMEAR, UNABLE TO ESTIMATE  --  108* 96*   Recent Labs  Lab 04/16/19 1639 04/16/19 2054 04/17/19 1019 04/18/19 0603 04/19/19 0404  NA 139  --  140 139 137  K 3.8  --  4.1 4.9 4.6  CL 94*  --  96* 96* 94*  CO2 28  --  28 24 26   GLUCOSE 75  --  74 105* 80  BUN 29*  --  33* 46* 65*  CREATININE 6.90*  --  8.29* 9.75* 11.33*  CALCIUM 8.4*  --  8.8* 9.1 7.9*  PHOS  --  2.9  --   --   --    Recent Labs  Lab 04/16/19 1639  AST 19  ALT 13  ALKPHOS 59  BILITOT 0.4  PROT 6.8  ALBUMIN 3.2*    Terrilee Croak, MD  [facility] 04/19/2019

## 2019-04-19 NOTE — Progress Notes (Signed)
Patient PIV removed due to pain upon flushing. IV team consulted for new PIV and were not successful due to Patient Becoming agitated. MD notified of pt not having IV access. MD advised PIV was unnecessary due to potential DC to hospice 04/20/19.

## 2019-04-19 NOTE — Progress Notes (Signed)
Engineer, maintenance [facility]) Hospital Liaison note.    Received request from [place] for family interest in [facility]. [place] is unable to offer a room today. Left a voicemail for niece, [name].  Hospital Liaison will follow up tomorrow or sooner if a room becomes available.    A Please do not hesitate to call with questions.    Thank you,   Farrel Gordon, RN, CCM      [place] (listed on AMION under Hospice and [facility] [place])    [phone]

## 2019-04-19 NOTE — Progress Notes (Signed)
Palliative Medicine RN Note: Consult order noted for PMT from Renal dated 2/17, as well as TOC order for hospice referral from Dr Pietro Cassis dated 2/18. If family is ready for hospice, [facility] team can coordinate that referral without waiting for [place].   I have sent a secure chat to the [facility] team for Mr Copus. We do not have an available provider today. If there is a need for PMT involvement when a provider becomes available, we will reach out to the family.   Marjie Skiff Tyjah Hai, RN, BSN, [facility] Palliative Medicine Team 04/19/2019 1:54 PM Office [phone]

## 2019-04-20 DIAGNOSIS — Z515 Encounter for palliative care: Secondary | ICD-10-CM

## 2019-04-20 DIAGNOSIS — Z7189 Other specified counseling: Secondary | ICD-10-CM

## 2019-04-20 LAB — BASIC METABOLIC PANEL
Anion gap: 16 — ABNORMAL HIGH (ref 5–15)
BUN: 79 mg/dL — ABNORMAL HIGH (ref 8–23)
CO2: 25 mmol/L (ref 22–32)
Calcium: 7.8 mg/dL — ABNORMAL LOW (ref 8.9–10.3)
Chloride: 98 mmol/L (ref 98–111)
Creatinine, Ser: 12.83 mg/dL — ABNORMAL HIGH (ref 0.61–1.24)
GFR calc Af Amer: 4 mL/min — ABNORMAL LOW (ref >60)
GFR calc non Af Amer: 3 mL/min — ABNORMAL LOW (ref >60)
Glucose, Bld: 92 mg/dL (ref 70–99)
Potassium: 4.9 mmol/L (ref 3.5–5.1)
Sodium: 139 mmol/L (ref 135–145)

## 2019-04-20 LAB — TYPE AND SCREEN
ABO/RH(D): B POS
Antibody Screen: NEGATIVE
Unit division: 0
Unit division: 0
Unit division: 0
Unit division: 0

## 2019-04-20 LAB — BPAM RBC
Blood Product Expiration Date: 202103102359
Blood Product Expiration Date: 202103102359
Blood Product Expiration Date: 202103132359
Blood Product Expiration Date: 202103132359
ISSUE DATE / TIME: 202102121157
ISSUE DATE / TIME: 202102121157
ISSUE DATE / TIME: 202102160010
ISSUE DATE / TIME: 202102160437
Unit Type and Rh: 7300
Unit Type and Rh: 7300
Unit Type and Rh: 7300
Unit Type and Rh: 7300

## 2019-04-20 LAB — CBC WITH DIFFERENTIAL/PLATELET
Abs Immature Granulocytes: 0.01 10*3/uL (ref 0.00–0.07)
Basophils Absolute: 0 10*3/uL (ref 0.0–0.1)
Basophils Relative: 0 %
Eosinophils Absolute: 0.1 10*3/uL (ref 0.0–0.5)
Eosinophils Relative: 3 %
HCT: 26.5 % — ABNORMAL LOW (ref 39.0–52.0)
Hemoglobin: 8.2 g/dL — ABNORMAL LOW (ref 13.0–17.0)
Immature Granulocytes: 0 %
Lymphocytes Relative: 11 %
Lymphs Abs: 0.4 10*3/uL — ABNORMAL LOW (ref 0.7–4.0)
MCH: 31.2 pg (ref 26.0–34.0)
MCHC: 30.9 g/dL (ref 30.0–36.0)
MCV: 100.8 fL — ABNORMAL HIGH (ref 80.0–100.0)
Monocytes Absolute: 0.5 10*3/uL (ref 0.1–1.0)
Monocytes Relative: 12 %
Neutro Abs: 3.1 10*3/uL (ref 1.7–7.7)
Neutrophils Relative %: 74 %
Platelets: 96 10*3/uL — ABNORMAL LOW (ref 150–400)
RBC: 2.63 MIL/uL — ABNORMAL LOW (ref 4.22–5.81)
RDW: 21.4 % — ABNORMAL HIGH (ref 11.5–15.5)
WBC: 4.1 10*3/uL (ref 4.0–10.5)
nRBC: 0 % (ref 0.0–0.2)

## 2019-04-20 MED ORDER — PANTOPRAZOLE SODIUM 40 MG PO TBEC
40.0000 mg | DELAYED_RELEASE_TABLET | Freq: Two times a day (BID) | ORAL | Status: DC
  Administered 2019-04-20 – 2019-04-22 (×5): 40 mg via ORAL
  Filled 2019-04-20 (×6): qty 1

## 2019-04-20 NOTE — Consult Note (Signed)
Consultation Note Date: 04/20/2019   Patient Name: Jared Hancock  DOB: [birth year]  MRN: [id number]  Age / Sex: 74 y.o., male  PCP: Rosaria Ferries, MD Referring Physician: Terrilee Croak, MD  Reason for Consultation: Establishing goals of care  HPI/Patient Profile: Jared Hancock B73 y.o.malewith medical history significant ofESRD on HDMWF, HTN, HLD,seizure, presented with anemia. Pt went to his regular HD2/15and staff found him looked pale and drew bloodwork. Hb was 4.5 and pt was sent to [facility] ED.  Palliative Care was consulted to help address goals of care. Patient was slated to be discharged to a hospice home today, however family was asked to provide a 2800 deposit which they were unable to do. We were asked to continue conversations related to ongoing care inclusive of HD all of which had been stopped as of yesterday.   Clinical Assessment and Goals of Care: I have reviewed medical records including EPIC notes, labs and imaging, received report from bedside RN, assessed the patient.    I met with Jared Hancock to further discuss diagnosis prognosis, GOC, EOL wishes, disposition and options.   I introduced Palliative Medicine as specialized medical care for people living with serious illness. It focuses on providing relief from the symptoms and stress of a serious illness. The goal is to improve quality of life for both the patient and the family.  Jared Hancock shared with me some information about himself. He stated that he is from [place]. He states that he has been married three times and has four children. 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   Concepts specific to code status, artifical feeding and hydration, continued IV antibiotics and rehospitalization was had.  The difference between a aggressive medical intervention path   and a palliative comfort care path for this patient at this time was had. Values and goals of care important to patient and family were attempted to be elicited  Discussed with patient the importance of continued conversation with family and their  medical providers regarding overall plan of care and treatment options, ensuring decisions are within the context of the patients values and GOCs.  Unfortunately Jared Hancock was not very well able to follow our conversation. I asked if it would be alright with him if I reached out to his sister, Jared Hancock which he consented to.   I called Jared Hancock, she was unable to answer at which time therefore left a detailed message. I called three times thereafter without response.  I called patients niece, Jared Hancock and left a message. Called multiple times throughout the afternoon without a response.   Spoke to [place], we discussed GIP vs residential, presently he is at residential level of care therefore he would not qualify for no cost admission,. There would likely be a small co-payment however if Adal should develop active symptoms he would qualify for inpatient hospice. Liaison stated that Jared Hancock essential could not be bothers by Jaterrius's situation  as she has her own life.   Decision Maker: Jared Hancock (Sister) [phone]  SUMMARY OF RECOMMENDATIONS   Continue to reach out to family to better understand goals moving forward  Code Status/Advance Care Planning:  DNR  Symptom Management:  Per Primary Team  Palliative Prophylaxis:   Aspiration, Bowel Regimen, Delirium Protocol, Eye Care, Frequent Pain Assessment, Oral Care, Palliative Wound Care and Turn Reposition  Additional Recommendations (Limitations, Scope, Preferences):  Patient is present not receiving HD or getting aggressive interventions, he is essentially on a modified comfort oriented path.  Psycho-social/Spiritual:   Desire for further Chaplaincy support: YES  Additional  Recommendations: Caregiving  Support/Resources and Education on Hospice  Prognosis:   < 6 months  Discharge Planning: To Be Determined      Primary Diagnoses: Present on Admission: . Acute blood loss anemia  I have reviewed the medical record, interviewed the patient and family, and examined the patient. The following aspects are pertinent.  Past Medical History:  Diagnosis Date  . Acute on chronic respiratory failure with hypoxia ([place])   . Chronic systolic heart failure ([place])   . End stage renal disease on dialysis ([place])   . Lobar pneumonia ([place])   . Seizure disorder [facility])    Social History   Socioeconomic History  . Marital status: Divorced    Spouse name: Not on file  . Number of children: Not on file  . Years of education: Not on file  . Highest education level: Not on file  Occupational History  . Not on file  Tobacco Use  . Smoking status: Not on file  Substance and Sexual Activity  . Alcohol use: Not on file  . Drug use: Not on file  . Sexual activity: Not on file  Other Topics Concern  . Not on file  Social History Narrative  . Not on file   Social Determinants of Health   Financial Resource Strain:   . Difficulty of Paying Living Expenses: Not on file  Food Insecurity:   . Worried About Charity fundraiser in the Last Year: Not on file  . Ran Out of Food in the Last Year: Not on file  Transportation Needs:   . Lack of Transportation (Medical): Not on file  . Lack of Transportation (Non-Medical): Not on file  Physical Activity:   . Days of Exercise per Week: Not on file  . Minutes of Exercise per Session: Not on file  Stress:   . Feeling of Stress : Not on file  Social Connections:   . Frequency of Communication with Friends and Family: Not on file  . Frequency of Social Gatherings with Friends and Family: Not on file  . Attends Religious Services: Not on file  . Active Member of Clubs or Organizations: Not on file  . Attends Theatre manager Meetings: Not on file  . Marital Status: Not on file   No family history on file. Scheduled Meds: . Chlorhexidine Gluconate Cloth  6 each Topical Q0600  . diphenhydrAMINE  50 mg Oral Once   Or  . diphenhydrAMINE  50 mg Intravenous Once  . levETIRAcetam  500 mg Oral BID  . pantoprazole  40 mg Oral BID  . QUEtiapine  50 mg Oral QHS  . sevelamer carbonate  1,600 mg Oral TID WC   Continuous Infusions: PRN Meds:.acetaminophen, hydrALAZINE, ipratropium, LORazepam, LORazepam Medications Prior to Admission:  Prior to Admission medications   Medication Sig Start Date End Date Taking? Authorizing Provider  acetaminophen (TYLENOL) 325 MG tablet Take 650 mg by mouth every 6 (six) hours as needed for mild pain.   Yes [provider]  albuterol (VENTOLIN HFA) 108 (90 Base) MCG/ACT inhaler Inhale 2 puffs into the lungs every 6 (six) hours as needed for wheezing. 03/02/19  Yes [provider]  amLODipine (NORVASC) 5 MG tablet Take 5 mg by mouth daily. 03/26/19  Yes [provider]  B Complex-C-Folic Acid (RENAL VITAMIN PO) Take 1 tablet by mouth daily.   Yes [provider]  budesonide-formoterol (SYMBICORT) 160-4.5 MCG/ACT inhaler Inhale 2 puffs into the lungs 2 (two) times daily. 03/30/19  Yes [provider]  divalproex (DEPAKOTE SPRINKLE) 125 MG capsule Take 125 mg by mouth 2 (two) times daily. 04/12/19  Yes [provider]  hydrALAZINE (APRESOLINE) 100 MG tablet Take 100 mg by mouth 3 (three) times daily. 03/26/19  Yes [provider]  levETIRAcetam (KEPPRA) 500 MG tablet Take 500 mg by mouth See admin instructions. Take 500 tablet by mouth with 783m dose on Monday, Wednesday and Fridays   Yes [provider]  levETIRAcetam (KEPPRA) 750 MG tablet Take 750 mg by mouth daily. Take one tablet by mouth daily at 2pm per [facility]  Yes [provider]  Lidocaine (ASPERCREME LIDOCAINE) 4 % PTCH Apply 2 patches topically See  admin instructions. Apply two patches one to the right lower back and one patch to right lower Rib cage. Remove after 12 hours   Yes [provider]  lisinopril (ZESTRIL) 30 MG tablet Take 15 mg by mouth daily. 03/02/19  Yes [provider]  polyethylene glycol (MIRALAX / GLYCOLAX) 17 g packet Take 17 g by mouth daily.   Yes [provider]  QUEtiapine (SEROQUEL) 50 MG tablet Take 50 mg by mouth at bedtime. 03/26/19  Yes [provider]  rosuvastatin (CRESTOR) 5 MG tablet Take 5 mg by mouth daily. 03/02/19  Yes [provider]  sertraline (ZOLOFT) 25 MG tablet Take 25 mg by mouth daily. Take 2100mtablet by mouth daily along with the 5088mablet to equal 58m77mily per MAR [facility]/21  Yes [provider]  sertraline (ZOLOFT) 25 MG tablet Take 25 mg by mouth daily. Take one 25mg35mlet by mouth along with 50mg 25mqual 58mg d66m per MAR   [facility] Hospital[facility], [facility], MD  sertraline (ZOLOFT) 50 MG tablet Take 50 mg by mouth daily. Take 50mg ta60m by mouth daily along with 25mg tab19mto equal 58mg dail49mr MAR [facility]/21[facility] [provider]  sevelamer carbonate (RENVELA) 800 MG tablet Take 800 mg by mouth 3 (three) times daily with meals.   Yes [provider]  SPIRIVA HANDIHALER 18 MCG inhalation capsule Place 1 capsule into inhaler and inhale daily. 03/30/19  Yes [provider]   Allergies  Allergen Reactions  . Ampicillin Anaphylaxis    Did it involve swelling of the face/tongue/throat, SOB, or low BP? N/A Did it involve sudden or severe rash/hives, skin peeling, or any reaction on the inside of your mouth or nose? Yes Did you need to seek medical attention at a hospital or doctor's office? N/A When did it last happen?N/A If all above answers are "NO", may proceed with cephalosporin use.  . Fish-Derived Products Anaphylaxis  . Shellfish Allergy Anaphylaxis  . Tetracyclines & Related Swelling  . Elastic Bandages & [Zinc]  Rash    Paper tape okay   Review of Systems  Constitutional: Positive for activity change.  Physical Exam Vitals and nursing note reviewed.  HENT:     Head: Normocephalic.     Nose: Nose normal.     Mouth/Throat:     Mouth: Mucous membranes are dry.  Eyes:     Pupils: Pupils are equal, round, and reactive to light.  Abdominal:     General: Bowel sounds are normal.  Musculoskeletal:     Cervical back: Normal range of motion.  Skin:    General: Skin is warm.     Capillary Refill: Capillary refill takes less than 2 seconds.  Neurological:     Mental Status: He is alert.     Comments: Oriented to person    Vital Signs: BP 132/76 (BP Location: Left Arm)   Pulse 76   Temp 97.8 F (36.6 C) (Oral)   Resp 18   Ht 6' (1.829 m)   Wt 66.9 kg   SpO2 98%   BMI 20.00 kg/m  Pain Scale: 0-10 POSS *See Group Information*: 1-Acceptable,Awake and alert Pain Score: 0-No pain  SpO2: SpO2: 98 % O2 Device:SpO2: 98 % O2 Flow Rate: .   IO: Intake/output summary:   Intake/Output Summary (Last 24 hours) at 04/20/2019 1310 Last data filed at 04/20/2019 1254 Gross per 24 hour  Intake 660 ml  Output 0 ml  Net 660 ml   LBM: Last BM Date: 04/19/19 Baseline Weight: Weight: 65.7 kg Most recent weight: Weight: 66.9 kg     Palliative Assessment/Data: 30%   Time In: 1200 Time Out: 1315 Time Total: 75 Greater than 50%  of this time was spent counseling and coordinating care related to the above assessment and plan.  Signed by: Rosezella Rumpf, NP   Please contact Palliative Medicine Team phone at [phone] for questions and concerns.  For individual provider: See Shea Evans

## 2019-04-20 NOTE — TOC Progression Note (Addendum)
Transition of Care [facility]) - Progression Note    Patient Details  Name: KHALED WHATCOTT MRN: [id number] Date of Birth: [birth year]  [facility]) CM/SW Contact  Sharlet Salina Mila Homer, LCSW Phone Number: 04/20/2019, 5:01 PM  Clinical Narrative:  Attempted to reach patient's niece, Zuri Merriman [phone]) and sister, Garret Mere [phone]) and messages left. Calls made to discuss patient's discharge disposition. CSW will f/u with [facility] supervisor regarding patient and inability for myself or Palliative NP to reach family.    Expected Discharge Plan: [place] Barriers to Discharge: Continued Medical Work up  Expected Discharge Plan and Services Expected Discharge Plan: [place] In-house Referral: Clinical Social Work Discharge Planning Services: Other - See comment(Back to SNF)   Living arrangements for the past 2 months: Skilled Nursing Facility([facility])                                     Social Determinants of Health (SDOH) Interventions    Readmission Risk Interventions No flowsheet data found.

## 2019-04-20 NOTE — Progress Notes (Signed)
Renal Navigator notified patient's OP HD clinic/[place] of no further HD and transition to Hospice care.  Alphonzo Cruise, [place] Renal Navigator [phone]

## 2019-04-20 NOTE — Progress Notes (Addendum)
PROGRESS NOTE  Jared Hancock  DOB: 09/24/1945  PCP: Rosaria Ferries, MD IN:[id number]  DOA: 04/16/2019 Admitted From: Home  LOS: 4 days   Chief Complaint  Patient presents with  . Abnormal Lab   Brief narrative: Jared Hancock is a 74 y.o.malewith medical history significant ofESRD on HD MWF, HTN, HLD, seizure, presented with anemia. Pt went to his regular HD 2/15 and staff found him looked pale and drew blood work. Hb was 4.5 and pt was sent to [facility] ED. Pt was very agitated in the ED but denied any abd pain, no SOB, no chest pain, he also denied any black tarry stool or blood in stool. In the ED, FOBT positive, repeat Hb 5.4. PRBC x2 ordered and GI as well as Nephrology consulted. Patient was admitted under hospitalist medicine team for further evaluation and management.  Patient was seen by GI and nephrology. Patient mental status remained poor for last 3 days. GI could not do endoscopy because of patient's significantly altered mental status. He has fluctuating mental status but never fully awake to be able to express his wishes.  Reportedly, when a nursing staff tried to obtain an IV line, patient stated 'I donot want to suffer anymore.' 2/18- I spoke with patient's niece Jared Hancock.  Patient also has a sister Jared Hancock. Patient does not have legal power of attorney. Jared Hancock stated to me that patient has been estranged to the family for a long time.  Over the years, she has noted that he is having progressive physical and mental decline. She and patient's sister had talked about his long-term care.  Both of them agreed to seek hospice care for him.  So on my conversation with her 2/18, she asked to arrange a discharge to hospice facility.  I changed patient's code status to DNR/DNI. I informed GI and nephrology attendings.  No further plan of endoscopy.  Dialysis was also discontinued. Apparently, one of the hospice facilities called her and discussed about the  financial responsibilities involved. At that point, the niece pulled her out of any decision-making or financial responsibilities for him.  So as of now, patient is off dialysis.  No plan of endoscopic intervention.  Patient is a little more awake today but is still not able to express his wish when asked. I think this is an ethically challenging situation. Care management team aware.   Subjective: Patient was seen and examined this morning.  Elderly African-American male, lying in bed.  He is little more awake today.  Tries to open eyes on verbal command.  Moaning few words.    Assessment/Plan: Acute blood loss anemia on chronic macrocytic anemia -Most recent hemoglobin in December 2020 (Care Everywhere) was 11.8 -FOBT positive with Hgb 5.4 at time of admission  -Transfused 2 unit packed red blood cells on 2/15 -Initially planned for GI intervention but canceled after plan was made for discharge to hospice. -Currently on PPI. -Hemoglobin 8.2, better than yesterday  Acute metabolic encephalopathy Underlying dementia ?  Underlying psychiatric issues -Fluctuating mental status.  But never fully awake tobe able to express his wish. -More awake today.  Not in wrist restraints today. -PTA meds include Depakote, Keppra, Seroquel, Zoloft.  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 -Ammonia level only mildly elevated  ESRD -was on hemodialysis MWF.  Stopped after determination of possible need on 12/18. -Blood pressure medicines remain on hold.  Blood pressure in normal range mostly at this time.  Treatment needed.  Chronic diastolic heart failure -Stable.  EF 55 to 60%.  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  Seizure disorder -Continue Keppra  DVT prophylaxis:  SCD Antimicrobials:  None Fluid: None Diet: Okay to feed if awake enough to eat.  Code Status:  DNR/DNI Mobility:  Impaired Discharge plan Please see above in the 'brief narrative' section  Consultants:   GI  Nephrology  IR   Antimicrobials: Anti-infectives (From admission, onward)   None        Code Status: DNR   Diet Order            Diet renal/carb modified with fluid restriction Diet-HS Snack? Nothing; Fluid restriction: 1200 mL Fluid; Room service appropriate? No; Fluid consistency: Thin  Diet effective now              Infusions:    Scheduled Meds: . Chlorhexidine Gluconate Cloth  6 each Topical Q0600  . diphenhydrAMINE  50 mg Oral Once   Or  . diphenhydrAMINE  50 mg Intravenous Once  . levETIRAcetam  500 mg Oral BID  . pantoprazole (PROTONIX) IV  40 mg Intravenous Q12H  . QUEtiapine  50 mg Oral QHS  . sevelamer carbonate  1,600 mg Oral TID WC    PRN meds: acetaminophen, hydrALAZINE, ipratropium, LORazepam, LORazepam   Objective: Vitals:   04/20/19 0412 04/20/19 0853  BP: (!) 157/64 132/76  Pulse: 96 76  Resp: 18 18  Temp: 97.8 F (36.6 C) 97.8 F (36.6 C)  SpO2: 98% 98%    Intake/Output Summary (Last 24 hours) at 04/20/2019 1155 Last data filed at 04/20/2019 0900 Gross per 24 hour  Intake 660 ml  Output 0 ml  Net 660 ml   Filed Weights   04/16/19 1629 04/18/19 2043 04/19/19 2054  Weight: 65.7 kg 65.7 kg 66.9 kg   Weight change: 1.2 kg Body mass index is 20 kg/m.   Physical Exam: General exam: Lying down in bed.  Tries to open eyes on verbal command.  Morning few words.  Not in physical distress.   Skin: No rashes, lesions or ulcers. HEENT: Atraumatic, normocephalic, supple neck, no obvious bleeding Lungs: Clear to auscultate bilaterally CVS: Regular rate and rhythm, no murmur GI/Abd soft, nontender, nondistended, bowel sound present CNS: Confused, partially awake.  Not on wrist restraints today. Psychiatry: Unable to examine Extremities: No pedal edema, no calf tenderness  Data Review: I have personally reviewed the laboratory data and  studies available.  Recent Labs  Lab 04/16/19 1639 04/17/19 1019 04/18/19 0603 04/19/19 0404 04/20/19 0331  WBC 4.3  --  2.0* 4.9 4.1  NEUTROABS 3.0  --   --  3.9 3.1  HGB 5.4* 8.5* 9.7* 7.7* 8.2*  HCT 18.3* 26.3* 30.7* 24.6* 26.5*  MCV 111.6*  --  98.7 99.2 100.8*  PLT PLATELET CLUMPS NOTED ON SMEAR, UNABLE TO ESTIMATE  --  108* 96* 96*   Recent Labs  Lab 04/16/19 1639 04/16/19 2054 04/17/19 1019 04/18/19 0603 04/19/19 0404 04/20/19 0331  NA 139  --  140 139 137 139  K 3.8  --  4.1 4.9 4.6 4.9  CL 94*  --  96* 96* 94* 98  CO2 28  --  28 24 26 25   GLUCOSE 75  --  74 105* 80 92  BUN 29*  --  33* 46* 65* 79*  CREATININE 6.90*  --  8.29* 9.75* 11.33* 12.83*  CALCIUM 8.4*  --  8.8* 9.1  7.9* 7.8*  PHOS  --  2.9  --   --   --   --    Recent Labs  Lab 04/16/19 1639  AST 19  ALT 13  ALKPHOS 59  BILITOT 0.4  PROT 6.8  ALBUMIN 3.2*    Terrilee Croak, MD  [facility] 04/20/2019

## 2019-04-20 NOTE — Consult Note (Signed)
[facility]  Telephone call from [facility] to refer the pt to our facility. We contacted the pt's niece Jared Hancock. She was not accepting of the hospice bed and asked that we not contact her anymore. She was verbal about having a family and she did not sign up to take this on.   We have updated the SW [name] of the [name]'s wishes. Jared Silversmith RN [phone]

## 2019-04-20 NOTE — Plan of Care (Signed)
  Problem: Activity: Goal: Risk for activity intolerance will decrease Outcome: Not Progressing   

## 2019-04-21 DIAGNOSIS — D649 Anemia, unspecified: Secondary | ICD-10-CM

## 2019-04-21 DIAGNOSIS — K922 Gastrointestinal hemorrhage, unspecified: Secondary | ICD-10-CM

## 2019-04-21 LAB — CBC WITH DIFFERENTIAL/PLATELET
Abs Immature Granulocytes: 0.02 10*3/uL (ref 0.00–0.07)
Basophils Absolute: 0 10*3/uL (ref 0.0–0.1)
Basophils Relative: 0 %
Eosinophils Absolute: 0.2 10*3/uL (ref 0.0–0.5)
Eosinophils Relative: 4 %
HCT: 29.7 % — ABNORMAL LOW (ref 39.0–52.0)
Hemoglobin: 9.4 g/dL — ABNORMAL LOW (ref 13.0–17.0)
Immature Granulocytes: 1 %
Lymphocytes Relative: 8 %
Lymphs Abs: 0.4 10*3/uL — ABNORMAL LOW (ref 0.7–4.0)
MCH: 31.5 pg (ref 26.0–34.0)
MCHC: 31.6 g/dL (ref 30.0–36.0)
MCV: 99.7 fL (ref 80.0–100.0)
Monocytes Absolute: 0.6 10*3/uL (ref 0.1–1.0)
Monocytes Relative: 14 %
Neutro Abs: 3.2 10*3/uL (ref 1.7–7.7)
Neutrophils Relative %: 73 %
Platelets: 79 10*3/uL — ABNORMAL LOW (ref 150–400)
RBC: 2.98 MIL/uL — ABNORMAL LOW (ref 4.22–5.81)
RDW: 21.1 % — ABNORMAL HIGH (ref 11.5–15.5)
WBC: 4.4 10*3/uL (ref 4.0–10.5)
nRBC: 0 % (ref 0.0–0.2)

## 2019-04-21 LAB — BASIC METABOLIC PANEL
Anion gap: 18 — ABNORMAL HIGH (ref 5–15)
BUN: 95 mg/dL — ABNORMAL HIGH (ref 8–23)
CO2: 24 mmol/L (ref 22–32)
Calcium: 8.3 mg/dL — ABNORMAL LOW (ref 8.9–10.3)
Chloride: 94 mmol/L — ABNORMAL LOW (ref 98–111)
Creatinine, Ser: 14.23 mg/dL — ABNORMAL HIGH (ref 0.61–1.24)
GFR calc Af Amer: 4 mL/min — ABNORMAL LOW (ref >60)
GFR calc non Af Amer: 3 mL/min — ABNORMAL LOW (ref >60)
Glucose, Bld: 80 mg/dL (ref 70–99)
Potassium: 4.6 mmol/L (ref 3.5–5.1)
Sodium: 136 mmol/L (ref 135–145)

## 2019-04-21 MED ORDER — LEVETIRACETAM 500 MG PO TABS
500.0000 mg | ORAL_TABLET | ORAL | Status: DC
  Filled 2019-04-21 (×2): qty 1

## 2019-04-21 MED ORDER — SERTRALINE HCL 50 MG PO TABS
75.0000 mg | ORAL_TABLET | Freq: Every day | ORAL | Status: DC
  Administered 2019-04-21 – 2019-04-22 (×2): 75 mg via ORAL
  Filled 2019-04-21 (×3): qty 2

## 2019-04-21 MED ORDER — DIVALPROEX SODIUM 125 MG PO CSDR
125.0000 mg | DELAYED_RELEASE_CAPSULE | Freq: Two times a day (BID) | ORAL | Status: DC
  Administered 2019-04-21 – 2019-04-22 (×3): 125 mg via ORAL
  Filled 2019-04-21 (×5): qty 1

## 2019-04-21 MED ORDER — QUETIAPINE FUMARATE 50 MG PO TABS
50.0000 mg | ORAL_TABLET | Freq: Every day | ORAL | Status: DC
  Administered 2019-04-21 – 2019-04-22 (×2): 50 mg via ORAL
  Filled 2019-04-21 (×2): qty 1

## 2019-04-21 MED ORDER — LEVETIRACETAM 500 MG PO TABS
750.0000 mg | ORAL_TABLET | Freq: Every day | ORAL | Status: DC
  Administered 2019-04-22 – 2019-04-24 (×2): 750 mg via ORAL
  Filled 2019-04-21 (×3): qty 1

## 2019-04-21 MED ORDER — LEVETIRACETAM 500 MG PO TABS: 750.0000 mg | ORAL_TABLET | Freq: Every day | ORAL | Status: DC

## 2019-04-21 NOTE — Plan of Care (Signed)
  Problem: Safety: Goal: Ability to remain free from injury will improve Outcome: Progressing   

## 2019-04-21 NOTE — Progress Notes (Signed)
   Palliative Medicine Inpatient Follow Up Note   HPI: ALEZ RIVETTE [id number] y.o.malewith medical history significant ofESRD on HDMWF, HTN, HLD,seizure, presented with anemia. Pt went to his regular HD2/15and staff found him looked pale and drew bloodwork. Hb was 4.5 and pt was sent to [facility] ED.  Palliative Care was consulted to help address goals of care. Patient was slated to be discharged to a hospice home yesterday, however family was asked to provide a 2800 deposit which they were unable to do.   As of 2/19 family has not returned calls from healthcare team.   Today's Discussion (04/21/2019): Chart reviewed. Patient has no complaints and is resting comfortably. He himself has not recently talked to any family members.   Vital Signs Vitals:   04/21/19 0949 04/21/19 1641  BP: 130/62 (!) 170/67  Pulse: 60 72  Resp: 16 18  Temp: 98 F (36.7 C)   SpO2: 98% 100%    Intake/Output Summary (Last 24 hours) at 04/21/2019 1701 Last data filed at 04/21/2019 1320 Gross per 24 hour  Intake 580 ml  Output 0 ml  Net 580 ml   Last Weight  Most recent update: 04/19/2019 11:01 PM   Weight  66.9 kg (147 lb 7.8 oz)           Physical Exam Vitals and nursing note reviewed.  HENT:     Head: Normocephalic.     Nose: Nose normal.     Mouth/Throat:     Mouth: Mucous membranes are dry.  Eyes:     Pupils: Pupils are equal, round, and reactive to light.  Abdominal:     General: Bowel sounds are normal.  Musculoskeletal:     Cervical back: Normal range of motion.  Skin:    General: Skin is warm.     Capillary Refill: Capillary refill takes less than 2 seconds.  Neurological:     Mental Status: He is alert.     Comments: Oriented to person   SUMMARY OF RECOMMENDATIONS   Continue to reach out to family at the present time they have not called the health team back despite multiple efforts to get into contact with them  Time Spent: 15 Greater than 50% of the time was spent in  counseling and coordination of care ______________________________________________________________________________________ [place] Team Team Cell Phone: [phone] Please utilize secure chat with additional questions, if there is no response within 30 minutes please call the above phone number  Palliative Medicine Team providers are available by phone from 7am to 7pm daily and can be reached through the team cell phone.  Should this patient require assistance outside of these hours, please call the patient's attending physician.

## 2019-04-21 NOTE — Progress Notes (Signed)
PROGRESS NOTE    Jared Hancock  [id number] DOB: [birth year] DOA: 04/16/2019 PCP: Rosaria Ferries, MD    Brief Narrative: 74 y.o.malewith medical history significant ofESRD on HDMWF, HTN, HLD,seizure, presented with anemia. Pt went to his regular HD2/15and staff found him looked pale and drew bloodwork. Hb was 4.5 and pt was sent to [facility] ED. Pt was very agitated in the ED but denied any abd pain, no SOB, no chest pain, he also denied any black tarry stool or blood in stool. In the ED,FOBT positive, repeat Hb 5.4. PRBC x2 orderedand GI as well as Nephrology consulted. Patient was admitted under hospitalist medicine team for further evaluation and management.  Patient was seen by GI and nephrology. Patient mental status remained poor for last 3 days. GI could not do endoscopy because of patient's significantly altered mental status. He has fluctuating mental status but never fully awake to be able to express his wishes.  Reportedly, when a nursing staff tried to obtain an IV line, patient stated 'I donot want to suffer anymore.' 2/18- I spoke with [name].  Patient also has a sister [name]. Patient does not have legal power of attorney. [name] stated to me that patient has been estranged to the family for a long time.  Over the years, she has noted that he is having progressive physical and mental decline. She and patient's sister had talked about his long-term care.  Both of them agreed to seek hospice care for him.  So on my conversation with her 2/18, she asked to arrange a discharge to hospice facility.  I changed patient's code status to DNR/DNI. I informed GI and nephrology attendings.  No further plan of endoscopy.  Dialysis was also discontinued. Apparently, one of the hospice facilities called her and discussed about the financial responsibilities involved. At that point, the niece pulled her out of any decision-making or financial  responsibilities for him.  So as of now, patient is off dialysis.  No plan of endoscopic intervention.  Patient is a little more awake today but is still not able to express his wish when asked. I think this is an ethically challenging situation. Care management team aware.   04/21/2019 patient sitting up in bed trying to eat breakfast  Assessment & Plan:   Active Problems:   Iron deficiency anemia due to chronic blood loss   Acute blood loss anemia   Palliative care by specialist   Goals of care, counseling/discussion   Acute blood loss anemia on chronic macrocytic anemia -Most recent hemoglobin in December 2020(Care Everywhere)was 11.8 -FOBT positivewith Hgb 5.4 at time of admission -Transfused2unit packed red blood cells on 2/15 -Initially planned for GI intervention but canceled after plan was made for discharge to hospice. -Currently on PPI.  9.4 today from 8.2.  Acute metabolic encephalopathy Underlying dementia ?  Underlying psychiatric issues -Fluctuating mental status.  But never fully awake tobe able to express his wish. -More awake today.  Not in wrist restraints today. -PTA meds include Depakote, Keppra, Seroquel, Zoloft.  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 -Ammonia level only mildly elevated  ESRD -was on hemodialysis MWF.  Stopped after determination of possible need on 12/18. -Blood pressure medicines remain on hold.  Blood pressure in normal range mostly at this time.  Treatment needed.    Chronic diastolic heart failure -Stable.  EF 55 to 60%.  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  Seizure disorder -Continue Keppra     Estimated body mass index is 20 kg/m as calculated from the following:   Height as of this encounter: 6' (1.829 m).   Weight as of this encounter: 66.9 kg.  DVT prophylaxis: SCD  code Status: DNR DNI  family Communication:  None Disposition Plan: Unknown at this time patient was supposed to be discharged to hospice but family backed up when they came to know about financial responsibilities.  Case manager consulted. Consultants:  GI IR and nephrology. Procedures: None Antimicrobials: None  Subjective: Resting in bed eating breakfast does not appear to be in any distress right eye swollen  Objective: Vitals:   04/20/19 1756 04/20/19 2233 04/21/19 0515 04/21/19 0949  BP: (!) 141/63 (!) 146/66 135/65 130/62  Pulse: 77 65 60 60  Resp:  18 18 16   Temp: 98.9 F (37.2 C) 97.6 F (36.4 C) 97.8 F (36.6 C) 98 F (36.7 C)  TempSrc: Oral Oral  Oral  SpO2: 98% 98% 97% 98%  Weight:      Height:        Intake/Output Summary (Last 24 hours) at 04/21/2019 1210 Last data filed at 04/21/2019 0900 Gross per 24 hour  Intake 760 ml  Output 0 ml  Net 760 ml   Filed Weights   04/16/19 1629 04/18/19 2043 04/19/19 2054  Weight: 65.7 kg 65.7 kg 66.9 kg    Examination: Right upper and eyelid swollen General exam: Appears calm and comfortable  Respiratory system: Clear to auscultation. Respiratory effort normal. Cardiovascular system: S1 & S2 heard, RRR. No JVD, murmurs, rubs, gallops or clicks. No pedal edema. Gastrointestinal system: Abdomen is nondistended, soft and nontender. No organomegaly or masses felt. Normal bowel sounds heard. Central nervous system: Alert and oriented. No focal neurological deficits. Extremities: Symmetric 5 x 5 power. Skin: No rashes, lesions or ulcers Psychiatry: Judgement and insight appear normal. Mood & affect appropriate.     Data Reviewed: I have personally reviewed following labs and imaging studies  CBC: Recent Labs  Lab 04/16/19 1639 04/16/19 1639 04/17/19 1019 04/18/19 0603 04/19/19 0404 04/20/19 0331 04/21/19 0743  WBC 4.3  --   --  2.0* 4.9 4.1 4.4  NEUTROABS 3.0  --   --   --  3.9 3.1 3.2  HGB 5.4*   < > 8.5* 9.7* 7.7* 8.2* 9.4*  HCT 18.3*   < > 26.3* 30.7*  24.6* 26.5* 29.7*  MCV 111.6*  --   --  98.7 99.2 100.8* 99.7  PLT PLATELET CLUMPS NOTED ON SMEAR, UNABLE TO ESTIMATE  --   --  108* 96* 96* 79*   < > = values in this interval not displayed.   Basic Metabolic Panel: Recent Labs  Lab 04/16/19 1639 04/16/19 2054 04/17/19 1019 04/18/19 0603 04/19/19 0404 04/20/19 0331 04/21/19 0743  NA   < >  --  140 139 137 139 136  K   < >  --  4.1 4.9 4.6 4.9 4.6  CL   < >  --  96* 96* 94* 98 94*  CO2   < >  --  28 24 26 25 24   GLUCOSE   < >  --  74 105* 80 92 80  BUN   < >  --  33* 46* 65* 79* 95*  CREATININE   < >  --  8.29* 9.75* 11.33* 12.83* 14.23*  CALCIUM   < >  --  8.8* 9.1 7.9* 7.8*  8.3*  PHOS  --  2.9  --   --   --   --   --    < > = values in this interval not displayed.   GFR: Estimated Creatinine Clearance: 4.5 mL/min (A) (by C-G formula based on SCr of 14.23 mg/dL (H)). Liver Function Tests: Recent Labs  Lab 04/16/19 1639  AST 19  ALT 13  ALKPHOS 59  BILITOT 0.4  PROT 6.8  ALBUMIN 3.2*   No results for input(s): LIPASE, AMYLASE in the last 168 hours. Recent Labs  Lab 04/18/19 1152 04/19/19 0404  AMMONIA 19 38*   Coagulation Profile: No results for input(s): INR, PROTIME in the last 168 hours. Cardiac Enzymes: No results for input(s): CKTOTAL, CKMB, CKMBINDEX, TROPONINI in the last 168 hours. BNP (last 3 results) No results for input(s): PROBNP in the last 8760 hours. HbA1C: No results for input(s): HGBA1C in the last 72 hours. CBG: Recent Labs  Lab 04/19/19 0955 04/19/19 1111  GLUCAP 66* 103*   Lipid Profile: No results for input(s): CHOL, HDL, LDLCALC, TRIG, CHOLHDL, LDLDIRECT in the last 72 hours. Thyroid Function Tests: No results for input(s): TSH, T4TOTAL, FREET4, T3FREE, THYROIDAB in the last 72 hours. Anemia Panel: No results for input(s): VITAMINB12, FOLATE, FERRITIN, TIBC, IRON, RETICCTPCT in the last 72 hours. Sepsis Labs: No results for input(s): PROCALCITON, LATICACIDVEN in the last 168  hours.  Recent Results (from the past 240 hour(s))  SARS CORONAVIRUS 2 (TAT 6-24 HRS) Nasopharyngeal Nasopharyngeal Swab     Status: None   Collection Time: 04/16/19  6:16 PM   Specimen: Nasopharyngeal Swab  Result Value Ref Range Status   SARS Coronavirus 2 NEGATIVE NEGATIVE Final    Comment: (NOTE) SARS-CoV-2 target nucleic acids are NOT DETECTED. The SARS-CoV-2 RNA is generally detectable in upper and lower respiratory specimens during the acute phase of infection. Negative results do not preclude SARS-CoV-2 infection, do not rule out co-infections with other pathogens, and should not be used as the sole basis for treatment or other patient management decisions. Negative results must be combined with clinical observations, patient history, and epidemiological information. The expected result is Negative. Fact Sheet for Patients: SugarRoll.be Fact Sheet for Healthcare Providers: https://www.woods-.com/ This test is not yet approved or cleared by the [place] FDA and  has been authorized for detection and/or diagnosis of SARS-CoV-2 by FDA under an Emergency Use Authorization (EUA). This EUA will remain  in effect (meaning this test can be used) for the duration of the COVID-19 declaration under Section 56 4(b)(1) of the Act, 21 U.S.C. section 360bbb-3(b)(1), unless the authorization is terminated or revoked sooner. Performed at [facility], [place] [address] [place], [place] [address]   MRSA PCR Screening     Status: None   Collection Time: 04/17/19  4:54 AM   Specimen: Nasal Mucosa; Nasopharyngeal  Result Value Ref Range Status   MRSA by PCR NEGATIVE NEGATIVE Final    Comment:        The GeneXpert MRSA Assay (FDA approved for NASAL specimens only), is one component of a comprehensive MRSA colonization surveillance program. It is not intended to diagnose MRSA infection nor to guide or monitor treatment for MRSA  infections. Performed at [facility], [place] [address] [place], [place] [address]          Radiology Studies: No results found.      Scheduled Meds: . Chlorhexidine Gluconate Cloth  6 each Topical Q0600  . diphenhydrAMINE  50  mg Oral Once   Or  . diphenhydrAMINE  50 mg Intravenous Once  . levETIRAcetam  500 mg Oral BID  . pantoprazole  40 mg Oral BID  . QUEtiapine  50 mg Oral QHS  . sevelamer carbonate  1,600 mg Oral TID WC   Continuous Infusions:   LOS: 5 days     Georgette Shell, MD 04/21/2019, 12:10 PM

## 2019-04-21 NOTE — TOC Progression Note (Signed)
Transition of Care [facility]) - Progression Note    Patient Details  Name: Jared Hancock MRN: [id number] Date of Birth: [birth year]  [facility] CM/SW [place], [place] Phone Number: 04/21/2019, 1:55 PM  Clinical Narrative:    CSW made telephone call to patient's sister [name] [phone]. CSW left a voicemail with name, hospital and callback number.    Expected Discharge Plan: [place] Barriers to Discharge: Continued Medical Work up  Expected Discharge Plan and Services Expected Discharge Plan: [place] In-house Referral: Clinical Social Work Discharge Planning Services: Other - See comment(Back to SNF)   Living arrangements for the past 2 months: Skilled Nursing Facility([facility])                                       Social Determinants of Health (SDOH) Interventions    Readmission Risk Interventions No flowsheet data found.

## 2019-04-22 MED ORDER — FENTANYL CITRATE (PF) 100 MCG/2ML IJ SOLN
25.0000 ug | INTRAMUSCULAR | Status: DC | PRN
  Administered 2019-04-22 (×2): 50 ug via INTRAVENOUS
  Administered 2019-04-23: 25 ug via INTRAVENOUS
  Administered 2019-04-23: 50 ug via INTRAVENOUS
  Filled 2019-04-22 (×4): qty 2

## 2019-04-22 NOTE — TOC Progression Note (Signed)
Transition of Care [facility]) - Progression Note    Patient Details  Name: BIBB JIRSA MRN: [id number] Date of Birth: 1946/01/30  Transition of Care [facility]) CM/SW [place], [place] Phone Number: [phone] 04/22/2019, 2:02 PM  Clinical Narrative:    CSW made telephone call to patient's sister [name] [phone]. CSW left a voicemail to return phone call.  Expected Discharge Plan: [place] Barriers to Discharge: Continued Medical Work up  Expected Discharge Plan and Services Expected Discharge Plan: [place] In-house Referral: Clinical Social Work Discharge Planning Services: Other - See comment(Back to SNF)   Living arrangements for the past 2 months: Skilled Nursing Facility([facility])                                       Social Determinants of Health (SDOH) Interventions    Readmission Risk Interventions No flowsheet data found.

## 2019-04-22 NOTE — Progress Notes (Signed)
   Palliative Medicine Inpatient Follow Up Note HPI: Jared Hancock [id number] y.o.malewith medical history significant ofESRD on HDMWF, HTN, HLD,seizure, presented with anemia. Pt went to his regular HD2/15and staff found him looked pale and drew bloodwork. Hb was 4.5 and pt was sent to [facility] ED.  Palliative Care was consulted to help address goals of care. Patient was slated to be discharged to a hospice home yesterday, however family was asked to provide a 2800 deposit which they were unable to do.   As of 2/19 and 2/20 family has not returned calls from healthcare team.   Today's Discussion (04/22/2019): Chart reviewed. Patient has no complaints at the time of assessment. He appears more lethargic than on prior days.   Vital Signs Vitals:   04/22/19 0510 04/22/19 0933  BP: (!) 149/74 (!) 159/73  Pulse: 78 89  Resp: 18 18  Temp: 98.7 F (37.1 C) 97.6 F (36.4 C)  SpO2: 95% 96%    Intake/Output Summary (Last 24 hours) at 04/22/2019 1436 Last data filed at 04/22/2019 1300 Gross per 24 hour  Intake 400 ml  Output 0 ml  Net 400 ml   Last Weight  Most recent update: 04/22/2019  2:57 AM   Weight  66.9 kg (147 lb 8.2 oz)           Physical Exam Vitals and nursing note reviewed.  HENT:     Head: Normocephalic.     Nose: Nose normal.     Mouth/Throat:     Mouth: Mucous membranes are dry.  Eyes:     Pupils: Pupils are equal, round, and reactive to light.  Abdominal:     General: Bowel sounds are normal.  Musculoskeletal:     Cervical back: Normal range of motion.  Skin:    General: Skin is warm.     Capillary Refill: Capillary refill takes less than 2 seconds.  Neurological:     Mental Status: He is alert.     Comments: Oriented to person   SUMMARY OF RECOMMENDATIONS   Continue to reach out to family at the present time they have not called the health team back despite multiple efforts to get into contact with them.  Presently patient remains in house until hospice  home is able to take him though this would require his family signing documents which they are refusing to do.   Time Spent: 15 Greater than 50% of the time was spent in counseling and coordination of care ______________________________________________________________________________________ [employer] Team Team Cell Phone: [phone] Please utilize secure chat with additional questions, if there is no response within 30 minutes please call the above phone number  Palliative Medicine Team providers are available by phone from 7am to 7pm daily and can be reached through the team cell phone.  Should this patient require assistance outside of these hours, please call the patient's attending physician.

## 2019-04-22 NOTE — Progress Notes (Signed)
PROGRESS NOTE    Jared Hancock  [id number] DOB: [birth year] DOA: 04/16/2019 PCP: Rosaria Ferries, MD   Brief Narrative:  Patient is a 74 year old male with history of ESRD on HD on M/W/F, hypertension, hyperlipidemia, seizure disorder, who initially presented with anemia.  He went to his regular hemodialysis on 2/15 and the staff found him pale.  Lab work showed hemoglobin of 4.5 he was sent to emergency department.  FOBT was positive.  Repeat hemoglobin was 5.4. 2 units of PRBC were ordered, GI and nephrology consulted.  He was confused so GI did not do any intervention.  After  discussion with his family, given his extremely poor prognosis, CODE STATUS changed to DNR.  Dialysis was discontinued.  Plan was to discharge him to residential hospice but not successful because of financial issues.  Social worker closely following.  SW trying to reach family.  Assessment & Plan:   Active Problems:   Iron deficiency anemia due to chronic blood loss   Acute blood loss anemia   Palliative care by specialist   Goals of care, counseling/discussion   Gastrointestinal hemorrhage   Acute on chronic normocytic anemia: Most recent hemoglobin in December 2020 was 11.8.  Presented with hemoglobin of 5.4.  FOBT was positive.  Status post 2 units of RBC transfusion.  Initially plan for GI intervention but was later canceled after he was made DNR and plan for residential hospice.  Continue PPI.  Currently hemoglobin stable in the range of 9.  Acute metabolic encephalopathy: Suspected underlying dementia.  Suspected underlying psychiatric issues.  Fluctuating mental status.  Continue current medications: Depakote, Keppra, Seroquel, Zoloft  ESRD: On hemodialysis on Monday, Wednesday and Friday.  Has been stopped now because of plan for residential hospice transfer.  Family aware  Diastolic congestive heart failure: Stable.  Ejection fraction of 55 to 60% as per previous echo  Concern for facial  swelling/SVC syndrome: IR consulted for large fistulogram on 2/17  But was unable to perform.  Seizure disorder: Continue Keppra         DVT prophylaxis:SCD Code Status: DNR Family Communication: None present at the bedside Disposition Plan: Plan for residential hospice transfer.  Pending placement due to financial issues from family.  Social work closely following.  He can be transferred to residential hospice  As soon as  possible.   Consultants: Palliative care,nephrology  Procedures:  Antimicrobials:  Anti-infectives (From admission, onward)   None      Subjective:  Patient seen and examined the bedside this morning.  Hemodynamically stable.  Looked comfortable, denies any complaints.  Eating his lunch.  Objective: Vitals:   04/21/19 1641 04/21/19 2031 04/22/19 0510 04/22/19 0933  BP: (!) 170/67 (!) 173/76 (!) 149/74 (!) 159/73  Pulse: 72 83 78 89  Resp: 18 15 18 18   Temp:  98.4 F (36.9 C) 98.7 F (37.1 C) 97.6 F (36.4 C)  TempSrc:    Oral  SpO2: 100% 99% 95% 96%  Weight:  66.9 kg    Height:        Intake/Output Summary (Last 24 hours) at 04/22/2019 1128 Last data filed at 04/22/2019 0510 Gross per 24 hour  Intake 280 ml  Output 0 ml  Net 280 ml   Filed Weights   04/18/19 2043 04/19/19 2054 04/21/19 2031  Weight: 65.7 kg 66.9 kg 66.9 kg    Examination:  General exam: Generalized weakness, deconditioned, debilitated HEENT: Right eyelid edematous.   Respiratory system: Diminished air sounds on the bases,  no wheezes or crackles  Cardiovascular system: S1 & S2 heard, RRR. No JVD, murmurs, rubs, gallops or clicks. No pedal edema. Gastrointestinal system: Abdomen is nondistended, soft and nontender. No organomegaly or masses felt. Normal bowel sounds heard. Central nervous system: Alert and awake. No focal neurological deficits. Extremities: No edema, no clubbing ,no cyanosis, Skin: No rashes, lesions or ulcers,no icterus ,no pallor   Data Reviewed:  I have personally reviewed following labs and imaging studies  CBC: Recent Labs  Lab 04/16/19 1639 04/16/19 1639 04/17/19 1019 04/18/19 0603 04/19/19 0404 04/20/19 0331 04/21/19 0743  WBC 4.3  --   --  2.0* 4.9 4.1 4.4  NEUTROABS 3.0  --   --   --  3.9 3.1 3.2  HGB 5.4*   < > 8.5* 9.7* 7.7* 8.2* 9.4*  HCT 18.3*   < > 26.3* 30.7* 24.6* 26.5* 29.7*  MCV 111.6*  --   --  98.7 99.2 100.8* 99.7  PLT PLATELET CLUMPS NOTED ON SMEAR, UNABLE TO ESTIMATE  --   --  108* 96* 96* 79*   < > = values in this interval not displayed.   Basic Metabolic Panel: Recent Labs  Lab 04/16/19 1639 04/16/19 2054 04/17/19 1019 04/18/19 0603 04/19/19 0404 04/20/19 0331 04/21/19 0743  NA   < >  --  140 139 137 139 136  K   < >  --  4.1 4.9 4.6 4.9 4.6  CL   < >  --  96* 96* 94* 98 94*  CO2   < >  --  28 24 26 25 24   GLUCOSE   < >  --  74 105* 80 92 80  BUN   < >  --  33* 46* 65* 79* 95*  CREATININE   < >  --  8.29* 9.75* 11.33* 12.83* 14.23*  CALCIUM   < >  --  8.8* 9.1 7.9* 7.8* 8.3*  PHOS  --  2.9  --   --   --   --   --    < > = values in this interval not displayed.   GFR: Estimated Creatinine Clearance: 4.5 mL/min (A) (by C-G formula based on SCr of 14.23 mg/dL (H)). Liver Function Tests: Recent Labs  Lab 04/16/19 1639  AST 19  ALT 13  ALKPHOS 59  BILITOT 0.4  PROT 6.8  ALBUMIN 3.2*   No results for input(s): LIPASE, AMYLASE in the last 168 hours. Recent Labs  Lab 04/18/19 1152 04/19/19 0404  AMMONIA 19 38*   Coagulation Profile: No results for input(s): INR, PROTIME in the last 168 hours. Cardiac Enzymes: No results for input(s): CKTOTAL, CKMB, CKMBINDEX, TROPONINI in the last 168 hours. BNP (last 3 results) No results for input(s): PROBNP in the last 8760 hours. HbA1C: No results for input(s): HGBA1C in the last 72 hours. CBG: Recent Labs  Lab 04/19/19 0955 04/19/19 1111  GLUCAP 66* 103*   Lipid Profile: No results for input(s): CHOL, HDL, LDLCALC, TRIG, CHOLHDL,  LDLDIRECT in the last 72 hours. Thyroid Function Tests: No results for input(s): TSH, T4TOTAL, FREET4, T3FREE, THYROIDAB in the last 72 hours. Anemia Panel: No results for input(s): VITAMINB12, FOLATE, FERRITIN, TIBC, IRON, RETICCTPCT in the last 72 hours. Sepsis Labs: No results for input(s): PROCALCITON, LATICACIDVEN in the last 168 hours.  Recent Results (from the past 240 hour(s))  SARS CORONAVIRUS 2 (TAT 6-24 HRS) Nasopharyngeal Nasopharyngeal Swab     Status: None   Collection Time: 04/16/19  6:16 PM  Specimen: Nasopharyngeal Swab  Result Value Ref Range Status   SARS Coronavirus 2 NEGATIVE NEGATIVE Final    Comment: (NOTE) SARS-CoV-2 target nucleic acids are NOT DETECTED. The SARS-CoV-2 RNA is generally detectable in upper and lower respiratory specimens during the acute phase of infection. Negative results do not preclude SARS-CoV-2 infection, do not rule out co-infections with other pathogens, and should not be used as the sole basis for treatment or other patient management decisions. Negative results must be combined with clinical observations, patient history, and epidemiological information. The expected result is Negative. Fact Sheet for Patients: SugarRoll.be Fact Sheet for Healthcare Providers: https://www.woods-mathews.com/ This test is not yet approved or cleared by the [place] FDA and  has been authorized for detection and/or diagnosis of SARS-CoV-2 by FDA under an Emergency Use Authorization (EUA). This EUA will remain  in effect (meaning this test can be used) for the duration of the COVID-19 declaration under Section 56 4(b)(1) of the Act, 21 U.S.C. section 360bbb-3(b)(1), unless the authorization is terminated or revoked sooner. Performed at [facility], [place] [address] [place], [place] [address]   MRSA PCR Screening     Status: None   Collection Time: 04/17/19  4:54 AM   Specimen: Nasal Mucosa;  Nasopharyngeal  Result Value Ref Range Status   MRSA by PCR NEGATIVE NEGATIVE Final    Comment:        The GeneXpert MRSA Assay (FDA approved for NASAL specimens only), is one component of a comprehensive MRSA colonization surveillance program. It is not intended to diagnose MRSA infection nor to guide or monitor treatment for MRSA infections. Performed at [facility], [place] [address], [place], [place] [address]          Radiology Studies: No results found.      Scheduled Meds: . Chlorhexidine Gluconate Cloth  6 each Topical Q0600  . diphenhydrAMINE  50 mg Oral Once   Or  . diphenhydrAMINE  50 mg Intravenous Once  . divalproex  125 mg Oral BID  . [START ON 04/23/2019] levETIRAcetam  500 mg Oral Q M,W,F  . levETIRAcetam  750 mg Oral Daily  . pantoprazole  40 mg Oral BID  . QUEtiapine  50 mg Oral QHS  . sertraline  75 mg Oral Daily  . sevelamer carbonate  1,600 mg Oral TID WC   Continuous Infusions:   LOS: 6 days    Time spent: 35 mins.More than 50% of that time was spent in counseling and/or coordination of care.      Shelly Coss, MD [facility] P2/21/2021, 11:28 AM

## 2019-04-22 NOTE — Plan of Care (Signed)
  Problem: Safety: Goal: Ability to remain free from injury will improve Outcome: Progressing   

## 2019-04-23 DIAGNOSIS — R52 Pain, unspecified: Secondary | ICD-10-CM

## 2019-04-23 DIAGNOSIS — N186 End stage renal disease: Secondary | ICD-10-CM

## 2019-04-23 DIAGNOSIS — Z515 Encounter for palliative care: Secondary | ICD-10-CM

## 2019-04-23 MED ORDER — MORPHINE SULFATE (CONCENTRATE) 10 MG/0.5ML PO SOLN
5.0000 mg | ORAL | Status: DC | PRN
  Administered 2019-04-23: 5 mg via ORAL
  Filled 2019-04-23: qty 0.5

## 2019-04-23 MED ORDER — LORAZEPAM 1 MG PO TABS
1.0000 mg | ORAL_TABLET | ORAL | Status: DC | PRN
  Administered 2019-04-23 – 2019-04-24 (×2): 1 mg via ORAL
  Filled 2019-04-23 (×2): qty 1

## 2019-04-23 NOTE — TOC Progression Note (Signed)
Transition of Care [facility]) - Progression Note    Patient Details  Name: Jared Hancock MRN: [id number] Date of Birth: 1946-01-01  [facility] CM/SW Contact  Sharlet Salina Mila Homer, LCSW Phone Number: 04/23/2019, 10:47 AM  Clinical Narrative:  Consulted with [facility] supervisor Barbette Or regarding patient, hospice placement and inability to make contact with family-niece and sister. Discussed possibility of patient returning to [facility] and this was discussed. Denyse Amass suggested that CSW contact SNF and if they are able to take patient back, advise MD that patient won't be going back to SNF initially with hospice services and contact family and let them know (even if by VM) that patient returning to [facility].   Talked with Arbie Cookey in admissions with [facility] regarding situation with Hospice facilities and family not responding. Asked about patient returning to their facility. Arbie Cookey will talk with their nursing director and get back with this CSW.       Expected Discharge Plan: [place] Barriers to Discharge: Continued Medical Work up  Expected Discharge Plan and Services Expected Discharge Plan: [place] In-house Referral: Clinical Social Work Discharge Planning Services: Other - See comment(Back to SNF)   Living arrangements for the past 2 months: Skilled Nursing Facility([facility])                                     Social Determinants of Health (SDOH) Interventions    Readmission Risk Interventions No flowsheet data found.

## 2019-04-23 NOTE — Progress Notes (Signed)
Patient ID: Jared Hancock, male   DOB: 04/26/1945, 74 y.o.   MRN: [id number]  This NP visited patient at the bedside as a follow up for palliative medicine needs and emotional support.  Patient has been transitioned to comfort as documented/ in note by  Jared Fragmin PA-C/04-19-19   Patient is lethargic and unable to follow commands currently.    Discussed this patient  with Lorriane Shire LCSW.  Lorriane Shire has spoken with niece/[name] today and plan is to transition patient to SNF with hospice services.    Discussed with treatment plan with  bedside RN.   Focus is comfort and dignity. Plan of Care: -DNR/DNI  -no further dialysis tx desired via EMR  - comfort feeds as tolerated -comfort and dignity are focus of care - symptom management  (convert symptom management medications to oral in anticipation for discharge to SNF) -hospice serviices for EOL care     Discussed with Dr Tawanna Solo and Colleen/ Renal liason and Vanessa/LCSW and bedside RN  Will f/u in the morning  Total time spent on the unit was 35 miuntes  Greater than 50% of the time was spent in counseling and coordination of care  Wadie Lessen NP  Palliative Medicine Team Team Phone # [phone] Pager [phone]

## 2019-04-23 NOTE — Progress Notes (Addendum)
PROGRESS NOTE    Jared Hancock  [id number] DOB: May 03, 1945 DOA: 04/16/2019 PCP: Rosaria Ferries, MD   Brief Narrative:  Patient is a 74 year old male with history of ESRD on HD on M/W/F, hypertension, hyperlipidemia, seizure disorder, who initially presented with anemia.  He went to his regular hemodialysis on 2/15 and the staff found him pale.  Lab work showed hemoglobin of 4.5 he was sent to emergency department.  FOBT was positive.  Repeat hemoglobin was 5.4. 2 units of PRBC were ordered, GI and nephrology consulted.  He was confused so GI did not do any intervention.  After  discussion with his family, given his extremely poor prognosis, CODE STATUS changed to DNR.  Dialysis was discontinued.  Plan was to discharge him to residential hospice but not successful because of financial issues of the family.  Social worker closely following.  SW trying to reach family.  Assessment & Plan:   Active Problems:   Iron deficiency anemia due to chronic blood loss   Acute blood loss anemia   Palliative care by specialist   Goals of care, counseling/discussion   Gastrointestinal hemorrhage   Acute on chronic normocytic anemia: Most recent hemoglobin in December 2020 was 11.8.  Presented with hemoglobin of 5.4.  FOBT was positive.  Status post 2 units of RBC transfusion.  Initially plan for GI intervention but was later canceled after he was made DNR and plan for residential hospice.  Continue PPI.  Currently hemoglobin stable in the range of 9.  Acute metabolic encephalopathy: Suspected underlying dementia.  Suspected underlying psychiatric issues.  Fluctuating mental status.  Continue current medications: Depakote, Keppra, Seroquel, Zoloft  ESRD: On hemodialysis on Monday, Wednesday and Friday.  Has been stopped now because of plan for residential hospice transfer.  Family aware  Diastolic congestive heart failure: Stable.  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  Seizure disorder: Continue Keppra         DVT prophylaxis:SCD Code Status: DNR Family Communication: None present at the bedside Disposition Plan: Family approved for  residential hospice transfer.  Pending transfer due to financial issues from family.  Social work closely following. .Comfort measure intiated here.  There is possibility that he can be discharged back to his skilled nursing facility tomorrow if family could not be contacted   Consultants: Palliative care,nephrology  Procedures:  Antimicrobials:  Anti-infectives (From admission, onward)   None      Subjective:  Patient seen and examined the bedside this morning.  Remains hemodynamically stable.  No significant changes from yesterday.  He was not in any any kind of distress Objective: Vitals:   04/22/19 0933 04/22/19 1648 04/22/19 2057 04/23/19 0503  BP: (!) 159/73 (!) 154/61 (!) 151/74 (!) 177/78  Pulse: 89 86 93 100  Resp: 18 18 16 15   Temp: 97.6 F (36.4 C) 98.2 F (36.8 C) 98.5 F (36.9 C) 97.9 F (36.6 C)  TempSrc: Oral Oral    SpO2: 96% 97% 95% 94%  Weight:      Height:        Intake/Output Summary (Last 24 hours) at 04/23/2019 0802 Last data filed at 04/23/2019 0503 Gross per 24 hour  Intake 360 ml  Output 0 ml  Net 360 ml   Filed Weights   04/18/19 2043 04/19/19 2054 04/21/19 2031  Weight: 65.7 kg 66.9 kg 66.9 kg  Examination:   General exam: Chronically ill looking, debilitated, deconditioned HEENT: Right facial edema  respiratory system:  no wheezes or crackles  Cardiovascular system: S1 & S2 heard, RRR. No JVD, murmurs, rubs, gallops or clicks. Gastrointestinal system: Abdomen is nondistended, soft and nontender. No organomegaly or masses felt. Normal bowel sounds heard. Central nervous system: Alert and awake Extremities: Right upper extremity edema  skin: No rashes,  lesions or ulcers,no icterus ,no pallor    Data Reviewed: I have personally reviewed following labs and imaging studies  CBC: Recent Labs  Lab 04/16/19 1639 04/16/19 1639 04/17/19 1019 04/18/19 0603 04/19/19 0404 04/20/19 0331 04/21/19 0743  WBC 4.3  --   --  2.0* 4.9 4.1 4.4  NEUTROABS 3.0  --   --   --  3.9 3.1 3.2  HGB 5.4*   < > 8.5* 9.7* 7.7* 8.2* 9.4*  HCT 18.3*   < > 26.3* 30.7* 24.6* 26.5* 29.7*  MCV 111.6*  --   --  98.7 99.2 100.8* 99.7  PLT PLATELET CLUMPS NOTED ON SMEAR, UNABLE TO ESTIMATE  --   --  108* 96* 96* 79*   < > = values in this interval not displayed.   Basic Metabolic Panel: Recent Labs  Lab 04/16/19 1639 04/16/19 2054 04/17/19 1019 04/18/19 0603 04/19/19 0404 04/20/19 0331 04/21/19 0743  NA   < >  --  140 139 137 139 136  K   < >  --  4.1 4.9 4.6 4.9 4.6  CL   < >  --  96* 96* 94* 98 94*  CO2   < >  --  28 24 26 25 24   GLUCOSE   < >  --  74 105* 80 92 80  BUN   < >  --  33* 46* 65* 79* 95*  CREATININE   < >  --  8.29* 9.75* 11.33* 12.83* 14.23*  CALCIUM   < >  --  8.8* 9.1 7.9* 7.8* 8.3*  PHOS  --  2.9  --   --   --   --   --    < > = values in this interval not displayed.   GFR: Estimated Creatinine Clearance: 4.5 mL/min (A) (by C-G formula based on SCr of 14.23 mg/dL (H)). Liver Function Tests: Recent Labs  Lab 04/16/19 1639  AST 19  ALT 13  ALKPHOS 59  BILITOT 0.4  PROT 6.8  ALBUMIN 3.2*   No results for input(s): LIPASE, AMYLASE in the last 168 hours. Recent Labs  Lab 04/18/19 1152 04/19/19 0404  AMMONIA 19 38*   Coagulation Profile: No results for input(s): INR, PROTIME in the last 168 hours. Cardiac Enzymes: No results for input(s): CKTOTAL, CKMB, CKMBINDEX, TROPONINI in the last 168 hours. BNP (last 3 results) No results for input(s): PROBNP in the last 8760 hours. HbA1C: No results for input(s): HGBA1C in the last 72 hours. CBG: Recent Labs  Lab 04/19/19 0955 04/19/19 1111  GLUCAP 66* 103*   Lipid  Profile: No results for input(s): CHOL, HDL, LDLCALC, TRIG, CHOLHDL, LDLDIRECT in the last 72 hours. Thyroid Function Tests: No results for input(s): TSH, T4TOTAL, FREET4, T3FREE, THYROIDAB in the last 72 hours. Anemia Panel: No results for input(s): VITAMINB12, FOLATE, FERRITIN, TIBC, IRON, RETICCTPCT in the last 72 hours. Sepsis Labs: No results for input(s): PROCALCITON, LATICACIDVEN in the last 168 hours.  Recent Results (from the past 240 hour(s))  SARS CORONAVIRUS 2 (TAT 6-24 HRS) Nasopharyngeal Nasopharyngeal Swab  Status: None   Collection Time: 04/16/19  6:16 PM   Specimen: Nasopharyngeal Swab  Result Value Ref Range Status   SARS Coronavirus 2 NEGATIVE NEGATIVE Final    Comment: (NOTE) SARS-CoV-2 target nucleic acids are NOT DETECTED. The SARS-CoV-2 RNA is generally detectable in upper and lower respiratory specimens during the acute phase of infection. Negative results do not preclude SARS-CoV-2 infection, do not rule out co-infections with other pathogens, and should not be used as the sole basis for treatment or other patient management decisions. Negative results must be combined with clinical observations, patient history, and epidemiological information. The expected result is Negative. Fact Sheet for Patients: SugarRoll.be Fact Sheet for Healthcare Providers: https://www.woods-mathews.com/ This test is not yet approved or cleared by the [place] FDA and  has been authorized for detection and/or diagnosis of SARS-CoV-2 by FDA under an Emergency Use Authorization (EUA). This EUA will remain  in effect (meaning this test can be used) for the duration of the COVID-19 declaration under Section 56 4(b)(1) of the Act, 21 U.S.C. section 360bbb-3(b)(1), unless the authorization is terminated or revoked sooner. Performed at [facility], [place] [address] [place], [place] [address]   MRSA PCR Screening     Status: None     Collection Time: 04/17/19  4:54 AM   Specimen: Nasal Mucosa; Nasopharyngeal  Result Value Ref Range Status   MRSA by PCR NEGATIVE NEGATIVE Final    Comment:        The GeneXpert MRSA Assay (FDA approved for NASAL specimens only), is one component of a comprehensive MRSA colonization surveillance program. It is not intended to diagnose MRSA infection nor to guide or monitor treatment for MRSA infections. Performed at [facility], [place] [address] [place], [place] [address]          Radiology Studies: No results found.      Scheduled Meds: . Chlorhexidine Gluconate Cloth  6 each Topical Q0600  . diphenhydrAMINE  50 mg Oral Once   Or  . diphenhydrAMINE  50 mg Intravenous Once  . divalproex  125 mg Oral BID  . levETIRAcetam  500 mg Oral Q M,W,F  . levETIRAcetam  750 mg Oral Daily  . pantoprazole  40 mg Oral BID  . QUEtiapine  50 mg Oral QHS  . sertraline  75 mg Oral Daily  . sevelamer carbonate  1,600 mg Oral TID WC   Continuous Infusions:   LOS: 7 days    Time spent: 35 mins.More than 50% of that time was spent in counseling and/or coordination of care.      Shelly Coss, MD [facility] P2/22/2021, 8:02 AM

## 2019-04-24 MED ORDER — MORPHINE SULFATE (CONCENTRATE) 10 MG/0.5ML PO SOLN: 5.0000 mg | ORAL | 0 refills | Status: AC | PRN

## 2019-04-24 MED ORDER — LORAZEPAM 1 MG PO TABS: 1.0000 mg | ORAL_TABLET | ORAL | 0 refills | Status: AC | PRN

## 2019-04-24 NOTE — TOC Transition Note (Signed)
Transition of Care (TOC) - CM/SW Discharge Note *Discharge back to [facility]  *Room 408-A *Number for Report: [phone]. Ask for [facility] Nurse   Patient Details  Name: Jared Hancock MRN: [id number] Date of Birth: 27-Sep-1945  Transition of Care [facility] CM/SW Contact:  Sable Feil, LCSW Phone Number: 04/24/2019, 2:36 PM   Clinical Narrative: Patient medically stable for discharge and is returning to [facility], where he is a LTC resident. Discharge information sent to facility and niece, Ms. Visaya contacted and informed of discharge. Mr Russello will be transported by [facility] non-emergency transport.     Final next level of care: Skilled Nursing Facility([facility]) Barriers to Discharge: Barriers Resolved   Patient Goals and CMS Choice Patient states their goals for this hospitalization and ongoing recovery are:: Daughter agreeable to patient returning to [facility] for comfort care, as dialysis has been stopped CMS Medicare.gov Compare Post Acute Care list provided to:: Other (Comment Required)(Not needed as patient returning to [facility]) Choice offered to / list presented to : NA  Discharge Placement   Existing PASRR number confirmed : 04/24/19          Patient chooses bed at: [facility] Patient to be transferred to facility by: Non-emergency transport Name of family member notified: Treydan Madeja - niece; [phone] Patient and family notified of of transfer: 04/24/19  Discharge Plan and Services In-house Referral: Clinical Social Work Discharge Planning Services: Other - See comment(Back to SNF)                                Social Determinants of Health (SDOH) Interventions  No SDOH interventions requested or needed at discharge.   Readmission Risk Interventions No flowsheet data found.

## 2019-04-24 NOTE — Progress Notes (Signed)
Report given to Joslyn Devon, RN at [facility] for patient arriving back there today via transport.

## 2019-04-24 NOTE — Discharge Summary (Signed)
Physician Discharge Summary  Jared Hancock [id number] DOB: 09/13/1945 DOA: 04/16/2019  PCP: Rosaria Ferries, MD  Admit date: 04/16/2019 Discharge date: 04/24/2019  Admitted From: SNF Disposition:  SNF  Discharge Condition:Stable CODE STATUS:Comfort Care Diet recommendation:  Regular  Brief/Interim Summary: Patient is a 74 year old male with history of ESRD on HD on M/W/F, hypertension, hyperlipidemia, seizure disorder, who initially presented with anemia.  He went to his regular hemodialysis on 2/15 and the dialysis staff found him pale.  Lab work showed hemoglobin of 4.5 he was sent to emergency department.  FOBT was positive.  Repeat hemoglobin was 5.4.  2 units of PRBC were ordered, GI and nephrology consulted.  He was confused so GI did not do any intervention.  After  discussion with his family, given his extremely poor prognosis, CODE STATUS changed to DNR.  Dialysis was discontinued.  Plan was to discharge him to residential hospice but not successful because of financial issues of the family.  Social worker closely following. He is  being discharged back to nursing facility today.  Following problems were addressed during his hospitalization:  Acute on chronic normocytic anemia: Most recent hemoglobin in December 2020 was 11.8.  Presented with hemoglobin of 5.4.  FOBT was positive.  Status post 2 units of RBC transfusion.  Initially plan for GI intervention but was later canceled after he was made DNR and planned for residential hospice. Last  hemoglobin stable in the range of 9.  Acute metabolic encephalopathy: Suspected underlying dementia. He has underlying psychiatric issues.  Fluctuating mental status.  Continue current medications.  ESRD: On hemodialysis on Monday, Wednesday and Friday.  Has been stopped now because of plan for residential hospice transfer.  Family aware.  Diastolic congestive heart failure: Stable.  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  Seizure disorder: Continue Keppra  Prognosis: Extremely poor.  Dialysis has been discontinued.  Currently he is on comfort care    Discharge Diagnoses:  Active Problems:   ESRD (end stage renal disease) (HCC)   Iron deficiency anemia due to chronic blood loss   Acute blood loss anemia   Palliative care by specialist   End of life care   Gastrointestinal hemorrhage   Pain, generalized    Discharge Instructions  Discharge Instructions    Diet - low sodium heart healthy   Complete by: As directed    Discharge instructions   Complete by: As directed    Take prescribed medications as instructed.   Increase activity slowly   Complete by: As directed      Allergies as of 04/24/2019      Reactions   Ampicillin Anaphylaxis   Did it involve swelling of the face/tongue/throat, SOB, or low BP? N/A Did it involve sudden or severe rash/hives, skin peeling, or any reaction on the inside of your mouth or nose? Yes Did you need to seek medical attention at a hospital or doctor's office? N/A When did it last happen?N/A If all above answers are "NO", may proceed with cephalosporin use.   Fish-derived Products Anaphylaxis   Shellfish Allergy Anaphylaxis   Tetracyclines & Related Swelling   Elastic Bandages & [zinc] Rash   Paper tape okay      Medication List    STOP taking these medications   divalproex 125 MG capsule Commonly known as: DEPAKOTE SPRINKLE   QUEtiapine 50 MG  tablet Commonly known as: SEROQUEL   rosuvastatin 5 MG tablet Commonly known as: CRESTOR   sertraline 25 MG tablet Commonly known as: ZOLOFT   sertraline 50 MG tablet Commonly known as: ZOLOFT     TAKE these medications   acetaminophen 325 MG tablet Commonly known as: TYLENOL Take 650 mg by mouth every 6 (six) hours as needed for mild pain.   albuterol 108 (90 Base)  MCG/ACT inhaler Commonly known as: VENTOLIN HFA Inhale 2 puffs into the lungs every 6 (six) hours as needed for wheezing.   amLODipine 5 MG tablet Commonly known as: NORVASC Take 5 mg by mouth daily.   Aspercreme Lidocaine 4 % Ptch Generic drug: Lidocaine Apply 2 patches topically See admin instructions. Apply two patches one to the right lower back and one patch to right lower Rib cage. Remove after 12 hours   budesonide-formoterol 160-4.5 MCG/ACT inhaler Commonly known as: SYMBICORT Inhale 2 puffs into the lungs 2 (two) times daily.   hydrALAZINE 100 MG tablet Commonly known as: APRESOLINE Take 100 mg by mouth 3 (three) times daily.   levETIRAcetam 500 MG tablet Commonly known as: KEPPRA Take 500 mg by mouth See admin instructions. Take 500 tablet by mouth with 750mg  dose on Monday, Wednesday and Fridays   levETIRAcetam 750 MG tablet Commonly known as: KEPPRA Take 750 mg by mouth daily. Take one tablet by mouth daily at 2pm per MAR   lisinopril 30 MG tablet Commonly known as: ZESTRIL Take 15 mg by mouth daily.   LORazepam 1 MG tablet Commonly known as: ATIVAN Take 1 tablet (1 mg total) by mouth every 4 (four) hours as needed for anxiety.   morphine CONCENTRATE 10 MG/0.5ML Soln concentrated solution Take 0.25 mLs (5 mg total) by mouth every hour as needed for moderate pain or shortness of breath.   polyethylene glycol 17 g packet Commonly known as: MIRALAX / GLYCOLAX Take 17 g by mouth daily.   RENAL VITAMIN PO Take 1 tablet by mouth daily.   sevelamer carbonate 800 MG tablet Commonly known as: RENVELA Take 800 mg by mouth 3 (three) times daily with meals.   Spiriva HandiHaler 18 MCG inhalation capsule Generic drug: tiotropium Place 1 capsule into inhaler and inhale daily.       Allergies  Allergen Reactions  . Ampicillin Anaphylaxis    Did it involve swelling of the face/tongue/throat, SOB, or low BP? N/A Did it involve sudden or severe rash/hives, skin  peeling, or any reaction on the inside of your mouth or nose? Yes Did you need to seek medical attention at a hospital or doctor's office? N/A When did it last happen?N/A If all above answers are "NO", may proceed with cephalosporin use.  . Fish-Derived Products Anaphylaxis  . Shellfish Allergy Anaphylaxis  . Tetracyclines & Related Swelling  . Elastic Bandages & [Zinc] Rash    Paper tape okay    Consultations:  Palliative care, nephrology   Procedures/Studies: DG Chest 1 View  Result Date: 04/16/2019 CLINICAL DATA:  Low hemoglobin EXAM: CHEST  1 VIEW COMPARISON:  06/11/2018 FINDINGS: Moderate cardiomegaly with mild central congestion. No overt edema, pleural effusion or pneumothorax. Aortic atherosclerosis. IMPRESSION: Cardiomegaly with mild central congestion Electronically Signed   By: Donavan Foil M.D.   On: 04/16/2019 19:01       Subjective: Patient seen and examined at the bedside this morning.  He was unresponsive.  Hemodynamically stable  Discharge Exam: Vitals:   04/24/19 0623 04/24/19 0820  BP: (!) 168/74 Marland Kitchen)  150/80  Pulse: 70 65  Resp: 18 16  Temp: (!) 97.4 F (36.3 C) (!) 97.5 F (36.4 C)  SpO2:  98%   Vitals:   04/23/19 0503 04/23/19 0900 04/24/19 0623 04/24/19 0820  BP: (!) 177/78 (!) 165/70 (!) 168/74 (!) 150/80  Pulse: 100 81 70 65  Resp: 15 17 18 16   Temp: 97.9 F (36.6 C) 98 F (36.7 C) (!) 97.4 F (36.3 C) (!) 97.5 F (36.4 C)  TempSrc:  Oral Oral Oral  SpO2: 94% 98%  98%  Weight:      Height:        General: Pt is not alert, awake,but  not in acute distress Cardiovascular: Sinus tachycardia, no rubs, no gallops Respiratory:  no wheezing, no rhonchi Abdominal: Soft, NT, ND, bowel sounds + Extremities: Right upper extremity edema, no cyanosis    The results of significant diagnostics from this hospitalization (including imaging, microbiology, ancillary and laboratory) are listed below for reference.     Microbiology: Recent  Results (from the past 240 hour(s))  SARS CORONAVIRUS 2 (TAT 6-24 HRS) Nasopharyngeal Nasopharyngeal Swab     Status: None   Collection Time: 04/16/19  6:16 PM   Specimen: Nasopharyngeal Swab  Result Value Ref Range Status   SARS Coronavirus 2 NEGATIVE NEGATIVE Final    Comment: (NOTE) SARS-CoV-2 target nucleic acids are NOT DETECTED. The SARS-CoV-2 RNA is generally detectable in upper and lower respiratory specimens during the acute phase of infection. Negative results do not preclude SARS-CoV-2 infection, do not rule out co-infections with other pathogens, and should not be used as the sole basis for treatment or other patient management decisions. Negative results must be combined with clinical observations, patient history, and epidemiological information. The expected result is Negative. Fact Sheet for Patients: SugarRoll.be Fact Sheet for Healthcare Providers: https://www.woods-mathews.com/ This test is not yet approved or cleared by the [place] FDA and  has been authorized for detection and/or diagnosis of SARS-CoV-2 by FDA under an Emergency Use Authorization (EUA). This EUA will remain  in effect (meaning this test can be used) for the duration of the COVID-19 declaration under Section 56 4(b)(1) of the Act, 21 U.S.C. section 360bbb-3(b)(1), unless the authorization is terminated or revoked sooner. Performed at [facility], [place] [address] [place], [place] [address]   MRSA PCR Screening     Status: None   Collection Time: 04/17/19  4:54 AM   Specimen: Nasal Mucosa; Nasopharyngeal  Result Value Ref Range Status   MRSA by PCR NEGATIVE NEGATIVE Final    Comment:        The GeneXpert MRSA Assay (FDA approved for NASAL specimens only), is one component of a comprehensive MRSA colonization surveillance program. It is not intended to diagnose MRSA infection nor to guide or monitor treatment for MRSA  infections. Performed at [facility], [place] [address], [place], [place] [address]      Labs: BNP (last 3 results) No results for input(s): BNP in the last 8760 hours. Basic Metabolic Panel: Recent Labs  Lab 04/18/19 0603 04/19/19 0404 04/20/19 0331 04/21/19 0743  NA 139 137 139 136  K 4.9 4.6 4.9 4.6  CL 96* 94* 98 94*  CO2 24 26 25 24   GLUCOSE 105* 80 92 80  BUN 46* 65* 79* 95*  CREATININE 9.75* 11.33* 12.83* 14.23*  CALCIUM 9.1 7.9* 7.8* 8.3*   Liver Function Tests: No results for input(s): AST, ALT, ALKPHOS, BILITOT, PROT, ALBUMIN in the last 168  hours. No results for input(s): LIPASE, AMYLASE in the last 168 hours. Recent Labs  Lab 04/18/19 1152 04/19/19 0404  AMMONIA 19 38*   CBC: Recent Labs  Lab 04/18/19 0603 04/19/19 0404 04/20/19 0331 04/21/19 0743  WBC 2.0* 4.9 4.1 4.4  NEUTROABS  --  3.9 3.1 3.2  HGB 9.7* 7.7* 8.2* 9.4*  HCT 30.7* 24.6* 26.5* 29.7*  MCV 98.7 99.2 100.8* 99.7  PLT 108* 96* 96* 79*   Cardiac Enzymes: No results for input(s): CKTOTAL, CKMB, CKMBINDEX, TROPONINI in the last 168 hours. BNP: Invalid input(s): POCBNP CBG: Recent Labs  Lab 04/19/19 0955 04/19/19 1111  GLUCAP 66* 103*   D-Dimer No results for input(s): DDIMER in the last 72 hours. Hgb A1c No results for input(s): HGBA1C in the last 72 hours. Lipid Profile No results for input(s): CHOL, HDL, LDLCALC, TRIG, CHOLHDL, LDLDIRECT in the last 72 hours. Thyroid function studies No results for input(s): TSH, T4TOTAL, T3FREE, THYROIDAB in the last 72 hours.  Invalid input(s): FREET3 Anemia work up No results for input(s): VITAMINB12, FOLATE, FERRITIN, TIBC, IRON, RETICCTPCT in the last 72 hours. Urinalysis No results found for: COLORURINE, APPEARANCEUR, [place], [place], [place], [place], [place], [place], PROTEINUR, UROBILINOGEN, NITRITE, LEUKOCYTESUR Sepsis Labs Invalid input(s): PROCALCITONIN,  WBC,  LACTICIDVEN Microbiology Recent Results (from the  past 240 hour(s))  SARS CORONAVIRUS 2 (TAT 6-24 HRS) Nasopharyngeal Nasopharyngeal Swab     Status: None   Collection Time: 04/16/19  6:16 PM   Specimen: Nasopharyngeal Swab  Result Value Ref Range Status   SARS Coronavirus 2 NEGATIVE NEGATIVE Final    Comment: (NOTE) SARS-CoV-2 target nucleic acids are NOT DETECTED. The SARS-CoV-2 RNA is generally detectable in upper and lower respiratory specimens during the acute phase of infection. Negative results do not preclude SARS-CoV-2 infection, do not rule out co-infections with other pathogens, and should not be used as the sole basis for treatment or other patient management decisions. Negative results must be combined with clinical observations, patient history, and epidemiological information. The expected result is Negative. Fact Sheet for Patients: SugarRoll.be Fact Sheet for Healthcare Providers: https://www.woods-mathews.com/ This test is not yet approved or cleared by the [place] FDA and  has been authorized for detection and/or diagnosis of SARS-CoV-2 by FDA under an Emergency Use Authorization (EUA). This EUA will remain  in effect (meaning this test can be used) for the duration of the COVID-19 declaration under Section 56 4(b)(1) of the Act, 21 U.S.C. section 360bbb-3(b)(1), unless the authorization is terminated or revoked sooner. Performed at [facility], [place] [address], [place], [place] [address]   MRSA PCR Screening     Status: None   Collection Time: 04/17/19  4:54 AM   Specimen: Nasal Mucosa; Nasopharyngeal  Result Value Ref Range Status   MRSA by PCR NEGATIVE NEGATIVE Final    Comment:        The GeneXpert MRSA Assay (FDA approved for NASAL specimens only), is one component of a comprehensive MRSA colonization surveillance program. It is not intended to diagnose MRSA infection nor to guide or monitor treatment for MRSA infections. Performed at [facility], [place] [address], [place], [place] [address]     Please note: You were cared for by a hospitalist during your hospital stay. Once you are discharged, your primary care physician will handle any further medical issues. Please note that NO REFILLS for any discharge medications will be authorized once you are discharged, as it is imperative that you return to your  primary care physician (or establish a relationship with a primary care physician if you do not have one) for your post hospital discharge needs so that they can reassess your need for medications and monitor your lab values.    Time coordinating discharge: 40 minutes  SIGNED:   Shelly Coss, MD  [facility] 04/24/2019, 10:59 AM Pager [phone]  If 7PM-7AM, please contact night-coverage [email or web address] Password [email or web address]

## 2019-04-24 NOTE — NC FL2 (Signed)
[place] MEDICAID FL2 LEVEL OF CARE SCREENING TOOL     IDENTIFICATION  Patient Name: Jared Hancock Birthdate: 09/24/1945 Sex: male Admission Date (Current Location): 04/16/2019  [place] and [place] Number:  [place] [id number] [place] and Address:  [facility], [place] [address] [place], [place] [address]      Provider Number: [id number]  Attending Physician Name and Address:  Shelly Coss, MD  Relative Name and Phone Number:  Stevphen Bacigalupi - niece; [phone] (mobile)    Current Level of Care: Hospital Recommended Level of Care: [place] Prior Approval Number:    Date Approved/Denied:   PASRR Number: [id number]  Discharge Plan: SNF    Current Diagnoses: Patient Active Problem List   Diagnosis Date Noted  . Pain, generalized   . Gastrointestinal hemorrhage   . Palliative care by specialist   . End of life care   . Iron deficiency anemia due to chronic blood loss 04/16/2019  . Acute blood loss anemia 04/16/2019  . Anemia   . Acute on chronic respiratory failure with hypoxia ([place])   . Seizure disorder ([place])   . ESRD (end stage renal disease) ([place])   . Lobar pneumonia ([place])   . Chronic systolic heart failure (HCC)     Orientation RESPIRATION BLADDER Height & Weight     (Disoriented X4)  Normal Continent Weight: 147 lb 8.2 oz (66.9 kg) Height:  6' (182.9 cm)  BEHAVIORAL SYMPTOMS/MOOD NEUROLOGICAL BOWEL NUTRITION STATUS    Convulsions/Seizures(History seizure disorder) Incontinent Diet(Regular)  AMBULATORY STATUS COMMUNICATION OF NEEDS Skin   Extensive Assist Verbally Skin abrasions(Skin tear right lower extremith (ankle) with gauze; Abrasion right arm/leg)                       Personal Care Assistance Level of Assistance  Bathing, Feeding, Dressing Bathing Assistance: Maximum assistance Feeding assistance: Limited assistance Dressing Assistance: Maximum assistance     Functional Limitations Info   Sight, Hearing, Speech Sight Info: Adequate Hearing Info: Adequate Speech Info: Adequate    SPECIAL CARE FACTORS FREQUENCY                       Contractures Contractures Info: Not present    Additional Factors Info  Code Status, Allergies Code Status Info: DNR Allergies Info: Ampicillin, Fish-derived Products, Shellfish Allergy, Tetracyclines & Related, Elastic, Bandages & Zinc           Current Medications (04/24/2019):  This is the current hospital active medication list Current Facility-Administered Medications  Medication Dose Route Frequency Provider Last Rate Last Admin  . acetaminophen (TYLENOL) tablet 650 mg  650 mg Oral Q6H PRN Terrilee Croak, MD   650 mg at 04/21/19 2117  . Chlorhexidine Gluconate Cloth 2 % PADS 6 each  6 each Topical Q0600 Loren Racer, PA-C   6 each at 04/24/19 [id number]  . diphenhydrAMINE (BENADRYL) capsule 50 mg  50 mg Oral Once Candiss Norse A, PA-C       Or  . diphenhydrAMINE (BENADRYL) injection 50 mg  50 mg Intravenous Once Candiss Norse A, PA-C      . levETIRAcetam (KEPPRA) tablet 500 mg  500 mg Oral Q M,W,F Dahal, [place], MD      . levETIRAcetam (KEPPRA) tablet 750 mg  750 mg Oral Daily Georgette Shell, MD   750 mg at 04/24/19 [id number]  . LORazepam (ATIVAN) tablet 1 mg  1 mg Oral Q4H PRN Knox Royalty, NP  1 mg at 04/24/19 0917  . morphine CONCENTRATE 10 MG/0.5ML oral solution 5 mg  5 mg Oral Q1H PRN Knox Royalty, NP   5 mg at 04/23/19 2325     Discharge Medications: Please see discharge summary for a list of discharge medications.  Relevant Imaging Results:  Relevant Lab Results:   Additional Information [phone]. Comfort care - has stopped dialysis  Shar[place], [place], LCSW

## 2019-04-30 DEATH — deceased

## 2019-10-25 IMAGING — DX DG ABD PORTABLE 1V
1 series · 1 of 1 positions shown · non-contrast
Comparison: None.

CLINICAL DATA: OG tube

EXAM:
PORTABLE ABDOMEN - 1 VIEW

[abdomen kub]
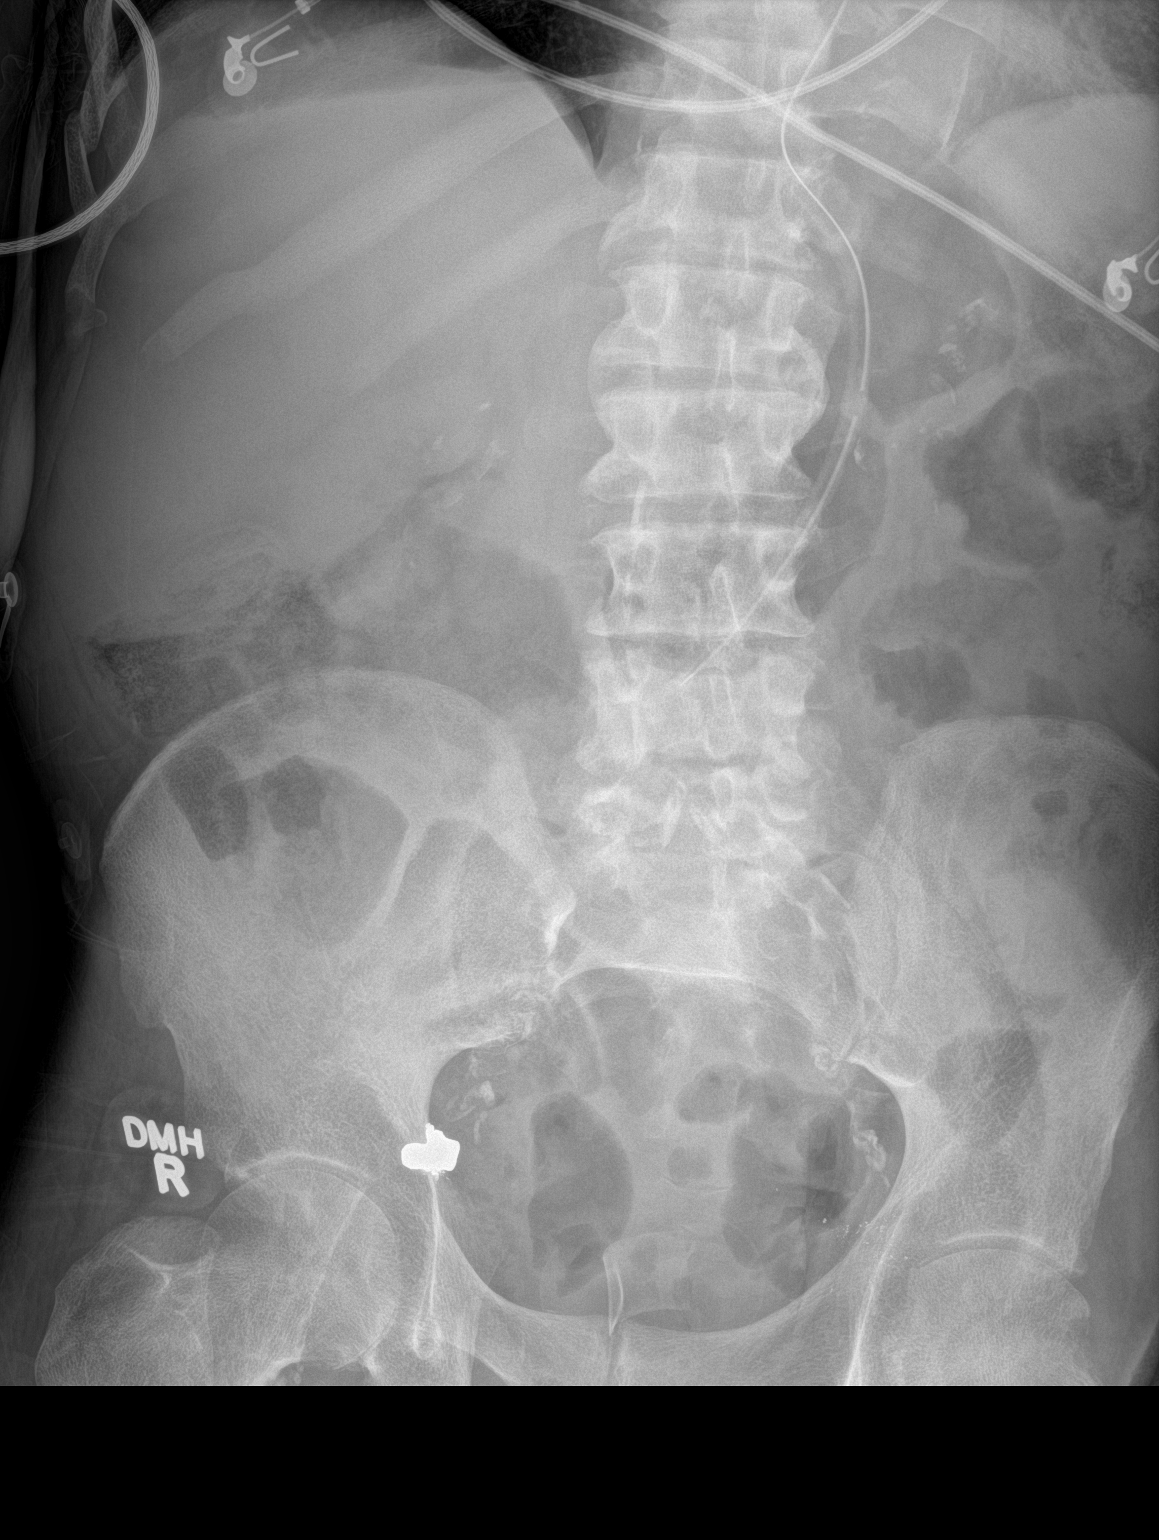

[1 of 1 positions shown; findings below may reference images not displayed]

FINDINGS: Esophageal tube tip projects over the distal stomach. Probable
vascular calcifications within the upper quadrants. Gas pattern is
nonobstructed. Metallic opacities within the pelvis.
IMPRESSION: 1. Esophageal tube tip overlies the distal stomach.
2. Nonobstructed gas pattern

## 2019-10-26 IMAGING — DX DG CHEST 1V PORT
1 series · 1 of 1 positions shown · non-contrast
Comparison: 05/03/2018

CLINICAL DATA: New ETT cuff, and new NG tube

EXAM:
PORTABLE CHEST 1 VIEW

[chest]
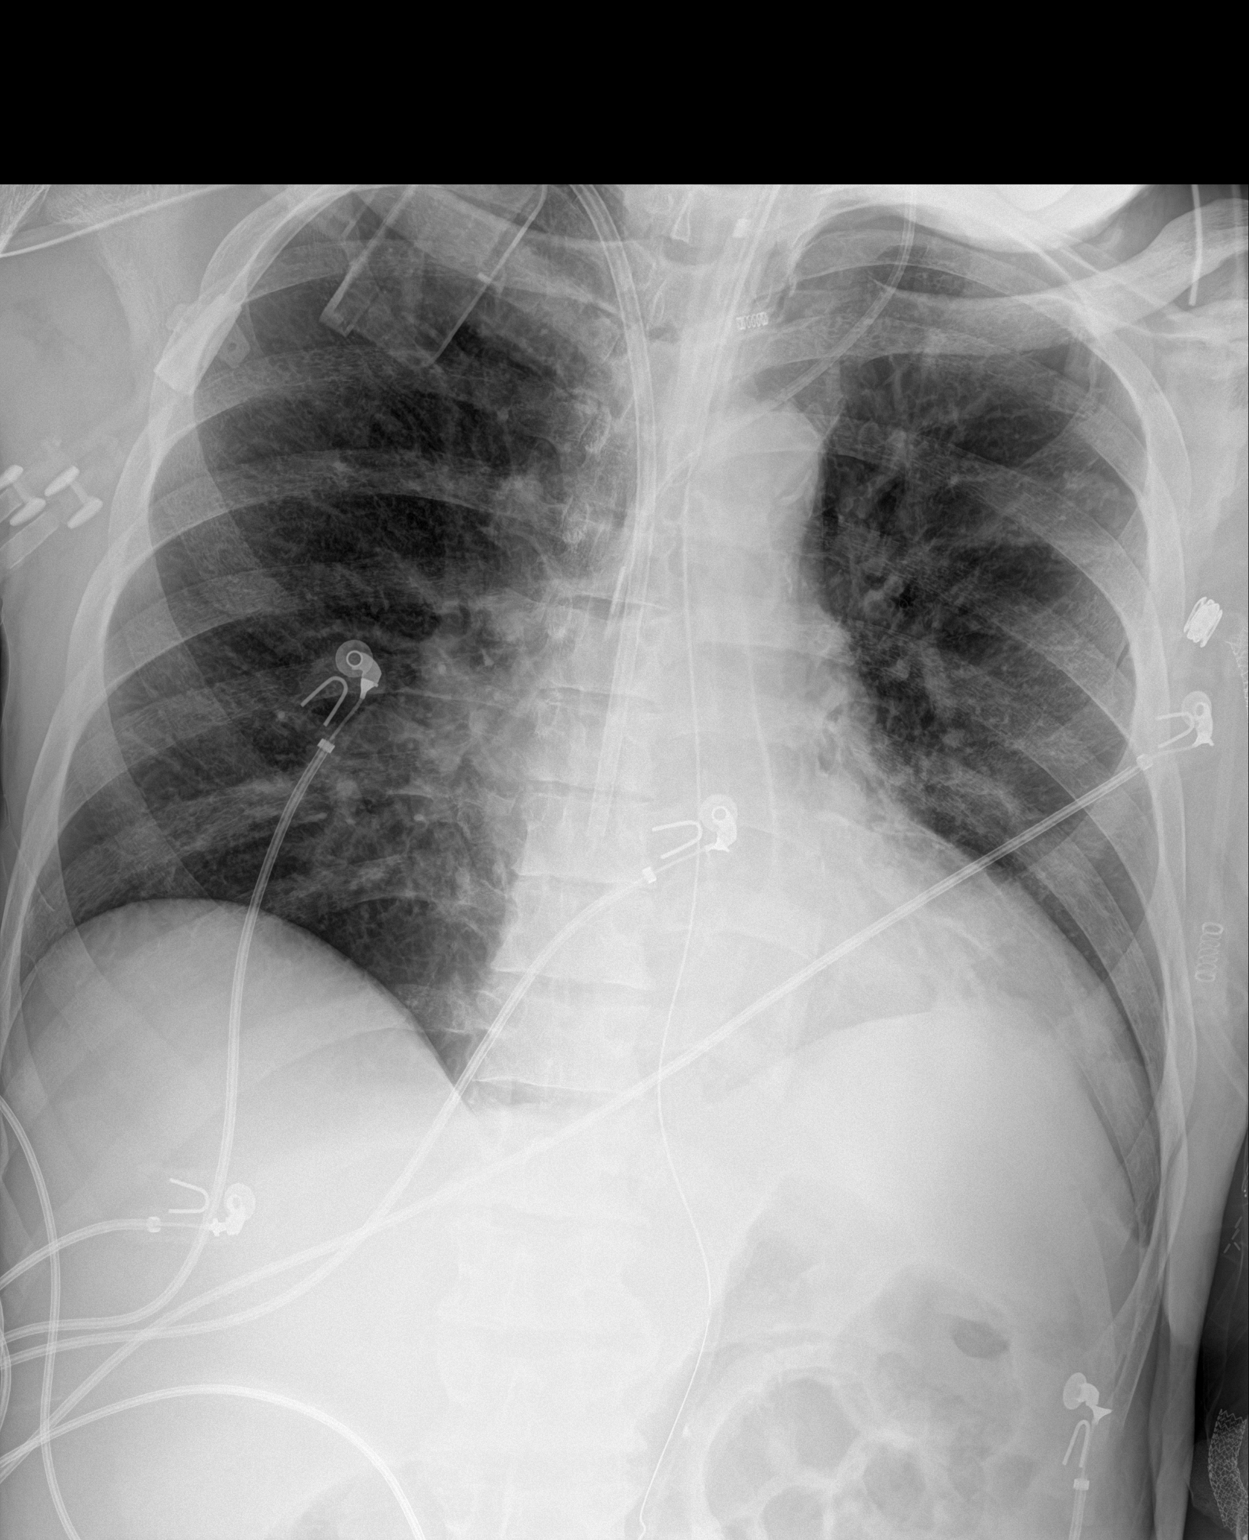

[1 of 1 positions shown; findings below may reference images not displayed]

FINDINGS: Endotracheal tube tip projects 2.4 cm above the carina.
Nasal/orogastric tube passes below the diaphragm into the mid
stomach.

Tunneled dual lumen central venous right internal jugular catheter
and left central venous catheter are stable.

Cardiac silhouette is normal in size. No mediastinal or hilar
masses.

Mild atelectasis at the left lung base.  Lungs otherwise clear.

No pneumothorax.
IMPRESSION: 1. New endotracheal tube tip projects 2.4 cm above the carina.
2. Nasal/orogastric tube passes below the diaphragm into the mid
stomach.
3. Mild left lung base atelectasis. Lungs otherwise clear. No other
change from the prior exam.

## 2019-12-03 IMAGING — DX PORTABLE CHEST - 1 VIEW
1 series · 1 of 1 positions shown · non-contrast
Comparison: Chest x-rays dated 05/11/2018 and 05/04/2018.

CLINICAL DATA: Fever. History of seizure disorder, lobar pneumonia,
chronic systolic heart failure, acute on chronic respiratory failure
with hypoxia.

EXAM:
PORTABLE CHEST 1 VIEW

[chest ap]
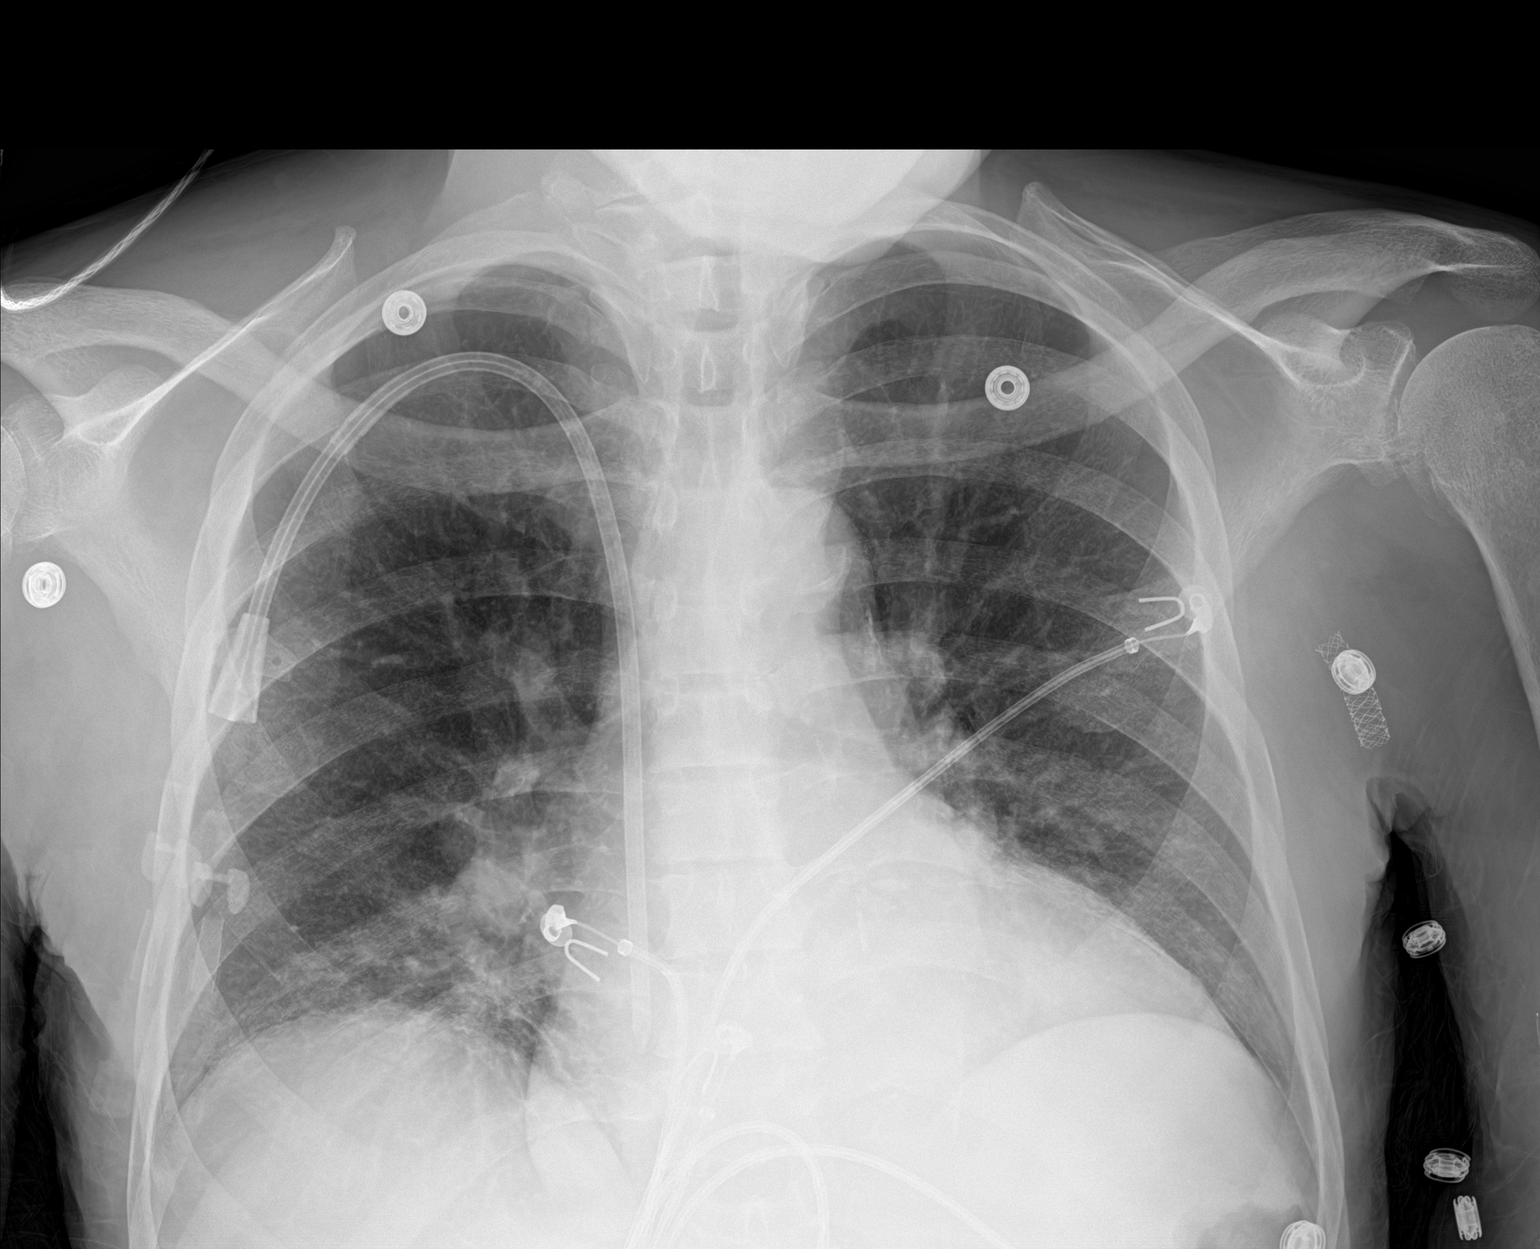

[1 of 1 positions shown; findings below may reference images not displayed]

FINDINGS: Stable cardiomegaly. RIGHT IJ dialysis catheter is well position
with tip overlying the RIGHT atrium. Crowding of the bronchovascular
structures at the RIGHT lung base. Lungs otherwise clear. No
confluent opacity to suggest a developing pneumonia. No evidence of
pulmonary edema. No pleural effusion or pneumothorax seen. Osseous
structures about the chest are unremarkable.
IMPRESSION: 1. No evidence of acute cardiopulmonary abnormality. No evidence of
pneumonia or pulmonary edema.
2. Stable cardiomegaly.
